# Patient Record
Sex: Female | Born: 1965 | Race: White | Hispanic: No | Marital: Married | Smoking: Never smoker
Health system: Southern US, Community
[De-identification: ages and names within clinical notes are randomized; demographics above are authoritative.]

## PROBLEM LIST (undated history)

## (undated) DIAGNOSIS — E039 Hypothyroidism, unspecified: Secondary | ICD-10-CM

## (undated) HISTORY — PX: FRACTURE SURGERY: SHX138

## (undated) HISTORY — PX: APPENDECTOMY: SHX54

---

## 2004-08-16 ENCOUNTER — Ambulatory Visit: Payer: Self-pay | Admitting: Specialist

## 2005-04-10 ENCOUNTER — Emergency Department: Payer: Self-pay | Admitting: Emergency Medicine

## 2005-04-10 ENCOUNTER — Ambulatory Visit: Payer: Self-pay | Admitting: Podiatry

## 2006-06-02 ENCOUNTER — Ambulatory Visit: Payer: Self-pay | Admitting: Internal Medicine

## 2008-03-11 ENCOUNTER — Emergency Department: Payer: Self-pay | Admitting: Emergency Medicine

## 2008-03-24 ENCOUNTER — Ambulatory Visit: Payer: Self-pay | Admitting: Podiatry

## 2009-02-20 IMAGING — US US EXTREM LOW VENOUS BILAT
1 series · 17 of 24 positions shown · non-contrast
Comparison: none

REASON FOR EXAM: bilateral leg edema
COMMENTS:

[Series 1: us extrem low venous bilat · 17 of 36 slices shown]
[im 1/36]
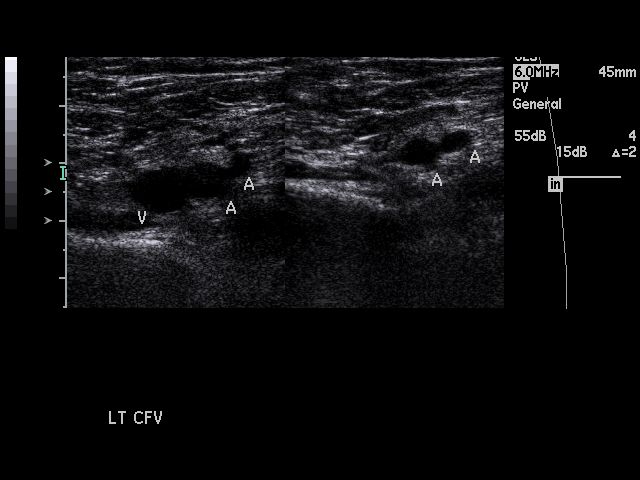
[im 4/36]
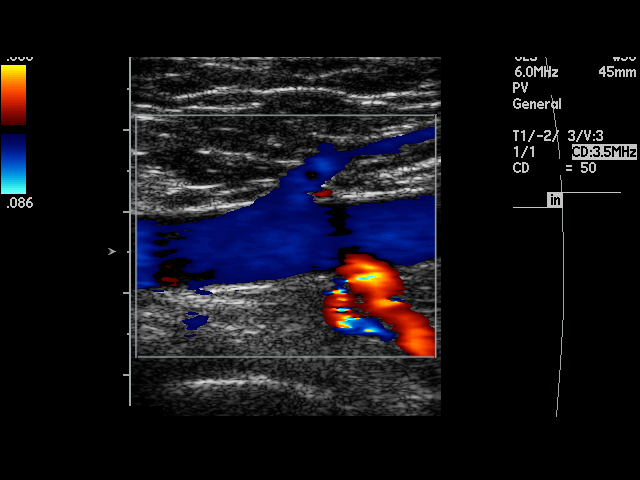
[im 5/36]
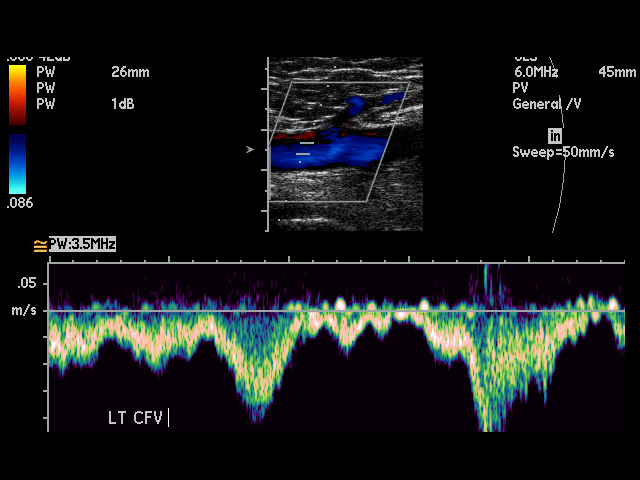
[im 7/36]
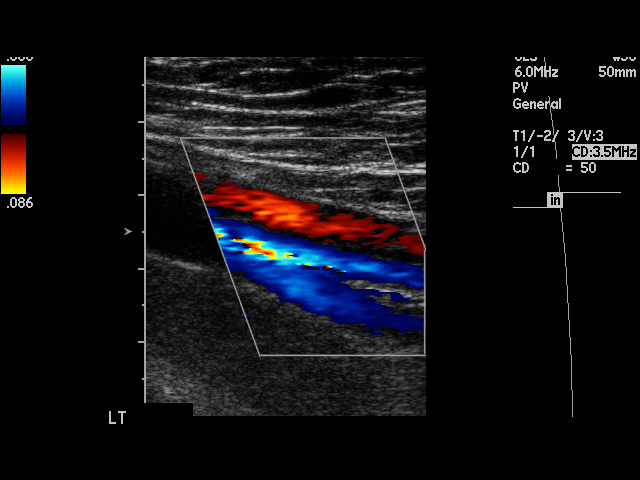
[im 10/36]
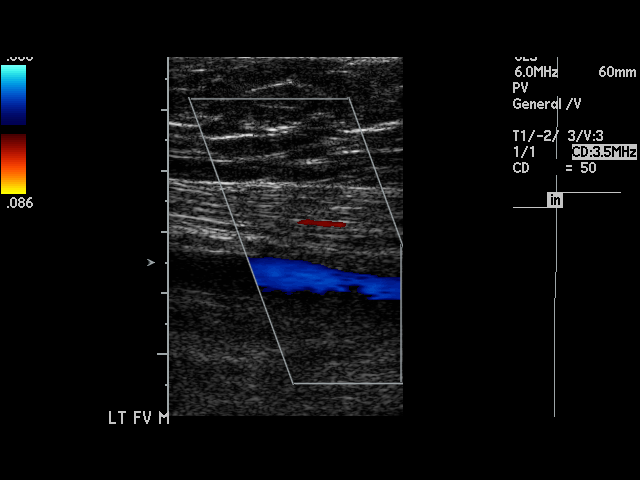
[im 11/36]
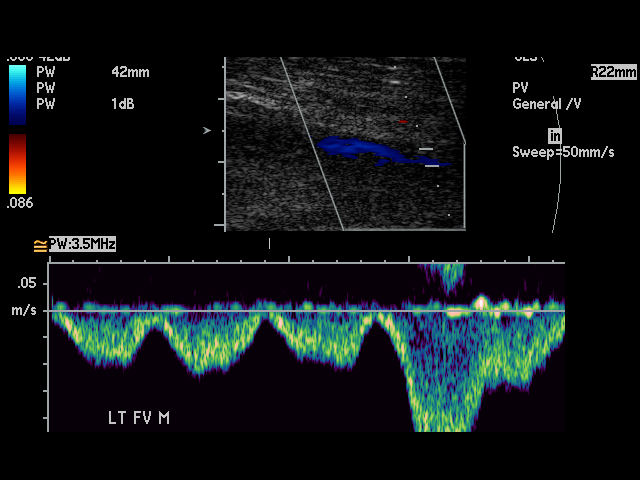
[im 14/36]
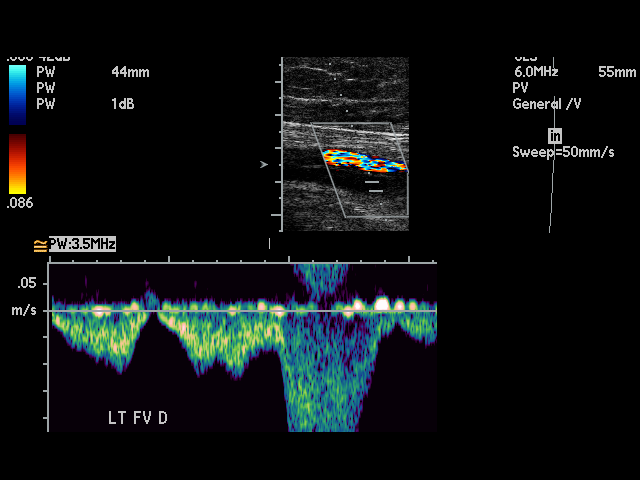
[im 16/36]
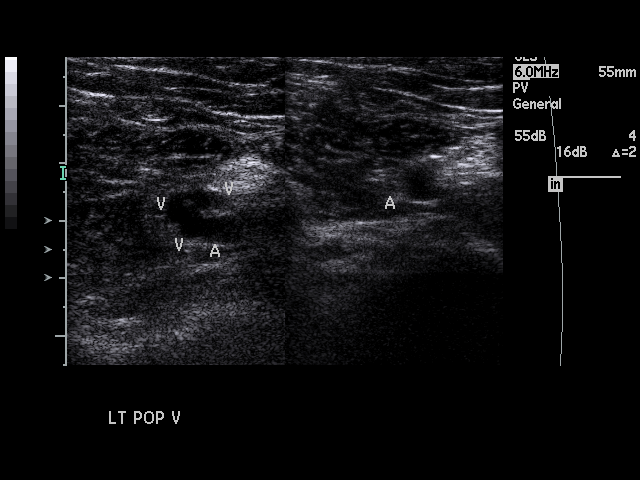
[im 19/36]
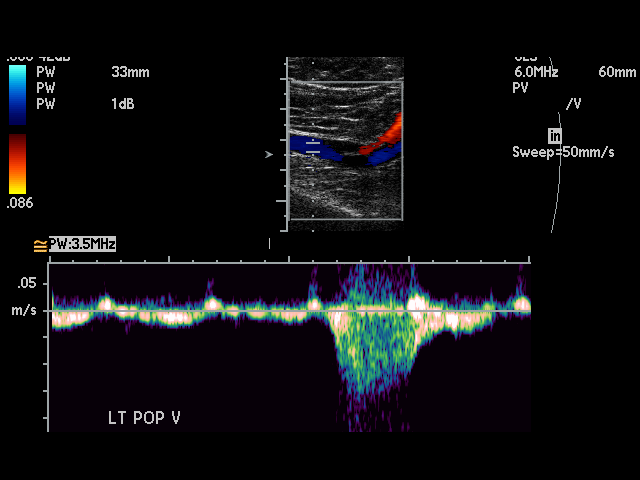
[im 20/36]
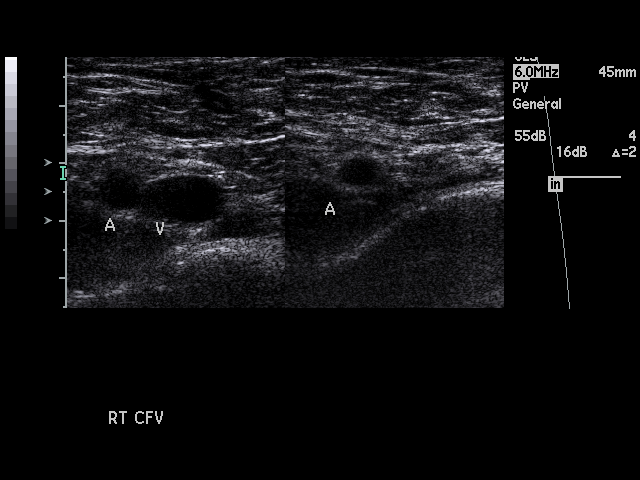
[im 22/36]
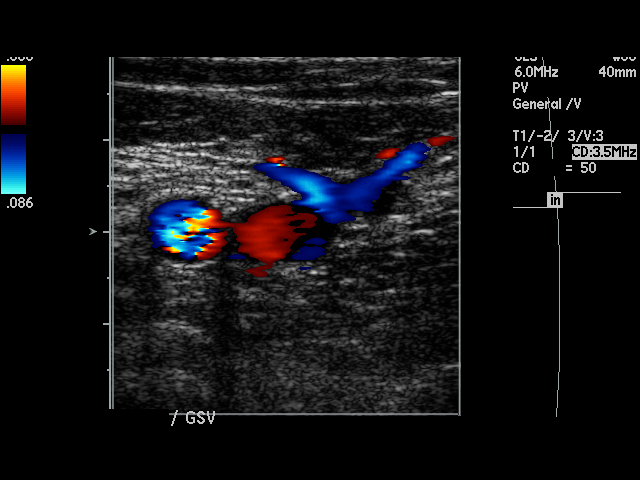
[im 25/36]
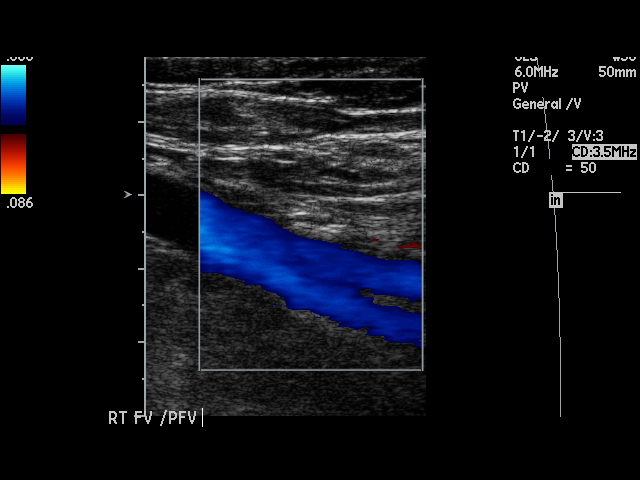
[im 26/36]
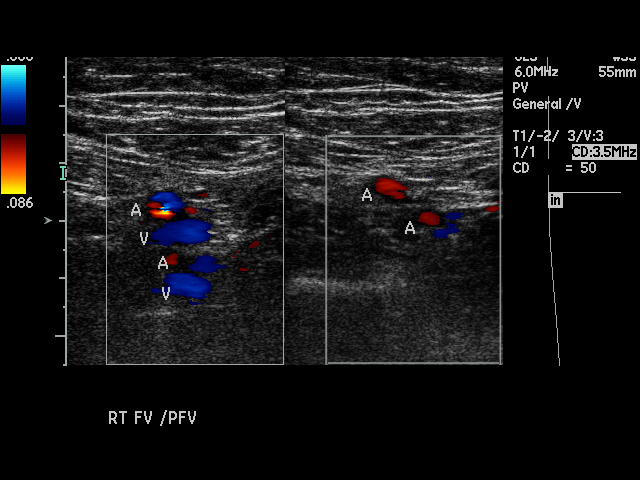
[im 29/36]
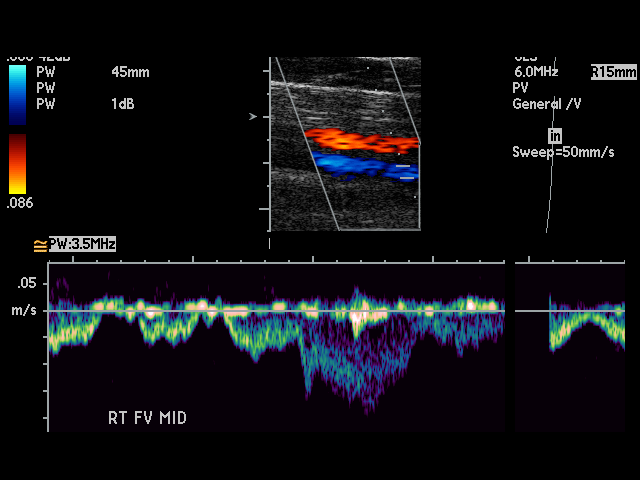
[im 31/36]
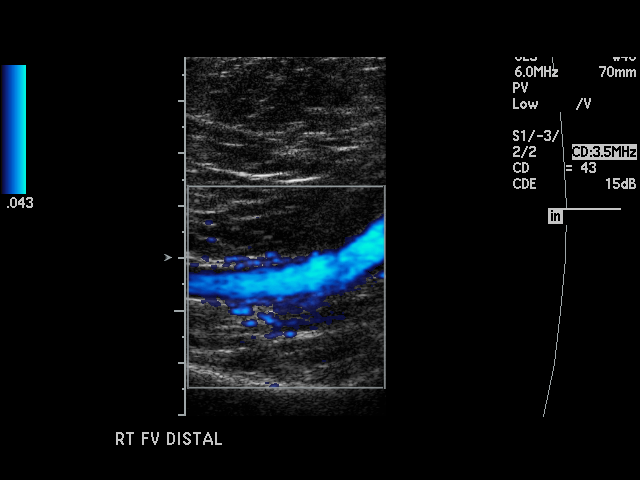
[im 32/36]
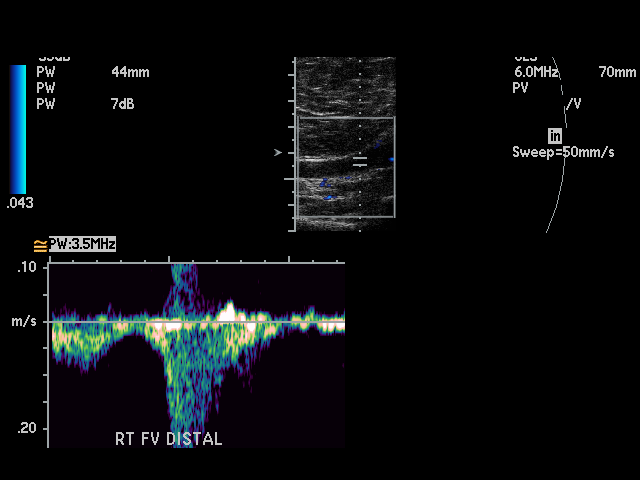
[im 36/36]
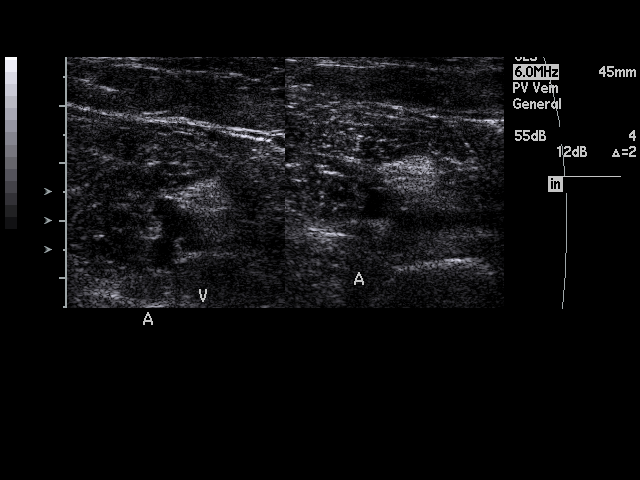

[17 of 24 positions shown; findings below may reference images not displayed]

PROCEDURE:     US  - US DOPPLER LOW EXTR BILATERAL  - June 02, 2006  [DATE]

RESULT:     The phasic and augmentation flow wave forms bilaterally are
normal in appearance. The femoral and popliteal veins bilaterally show
normal compressibility. Bilateral Doppler examination shows no deep venous
occlusion or thrombosis on either side.
IMPRESSION: 1.     Bilaterally normal study. No deep venous thrombosis is identified on
either side.

## 2009-08-15 ENCOUNTER — Emergency Department: Payer: Self-pay | Admitting: Emergency Medicine

## 2010-11-30 IMAGING — CR DG ANKLE COMPLETE 3+V*L*
1 series · 5 of 5 positions shown · non-contrast
Comparison: none

REASON FOR EXAM: pain/injury
COMMENTS:

[Series 1: view not recorded · 0.17mm/px · 5 of 5 slices shown]
[im 1/5]
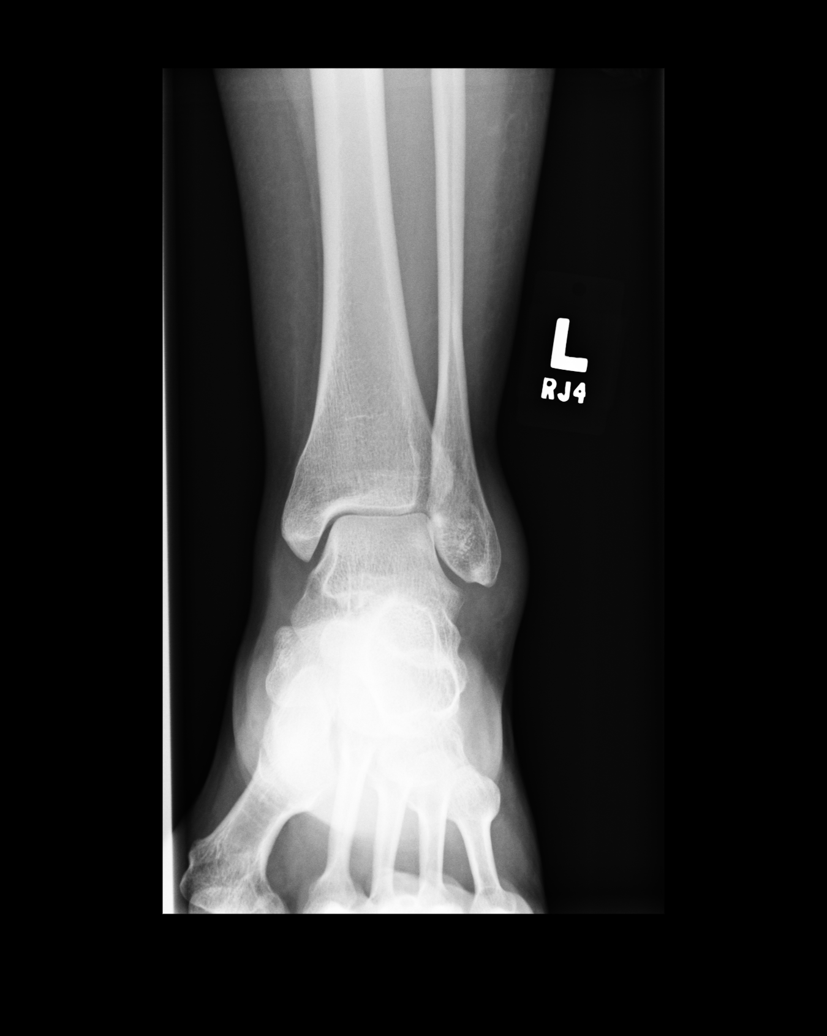
[im 2/5]
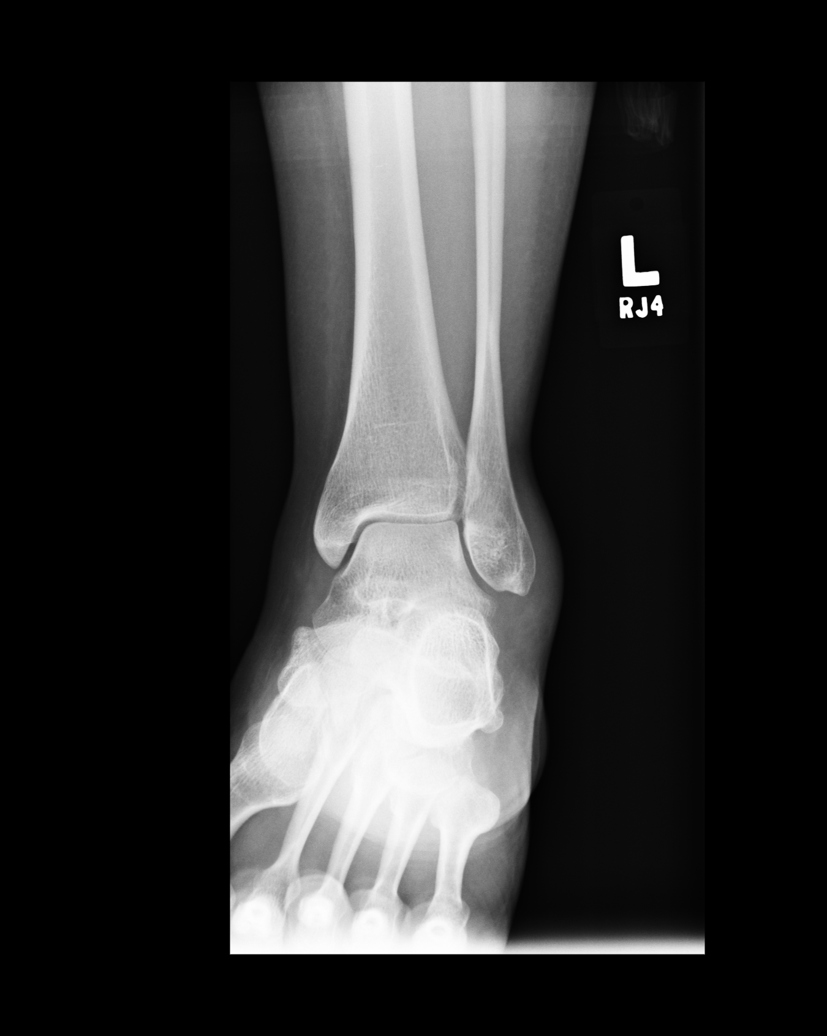
[im 3/5]
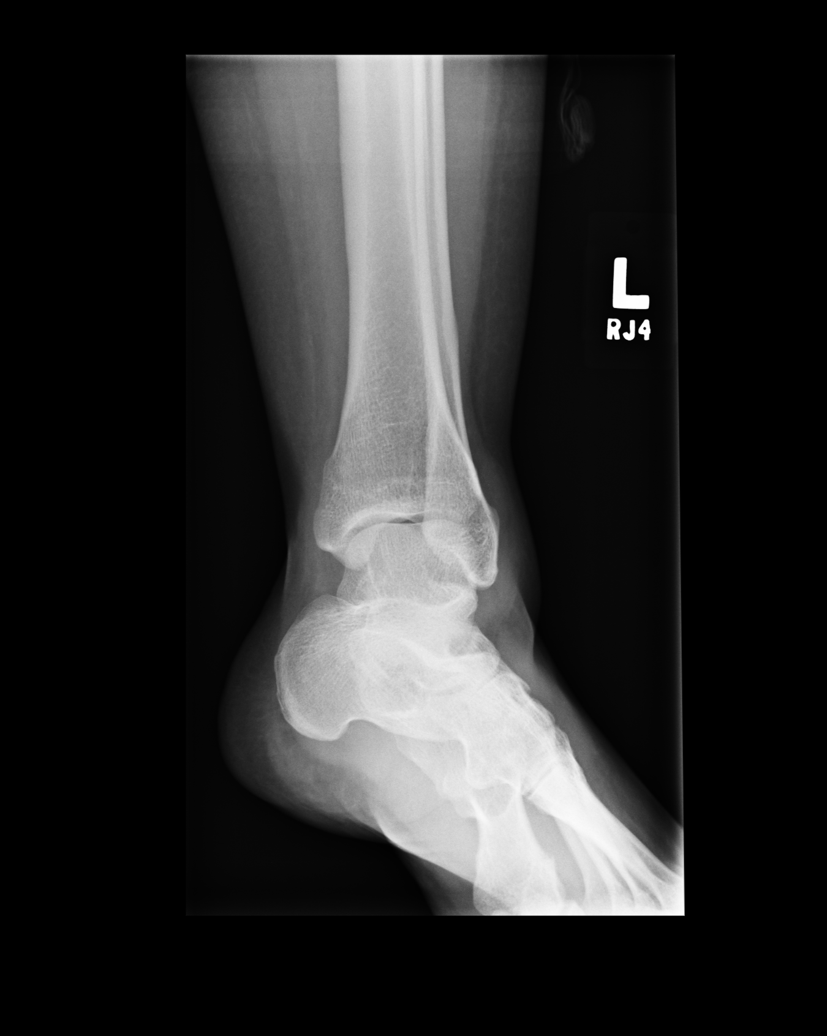
[im 4/5]
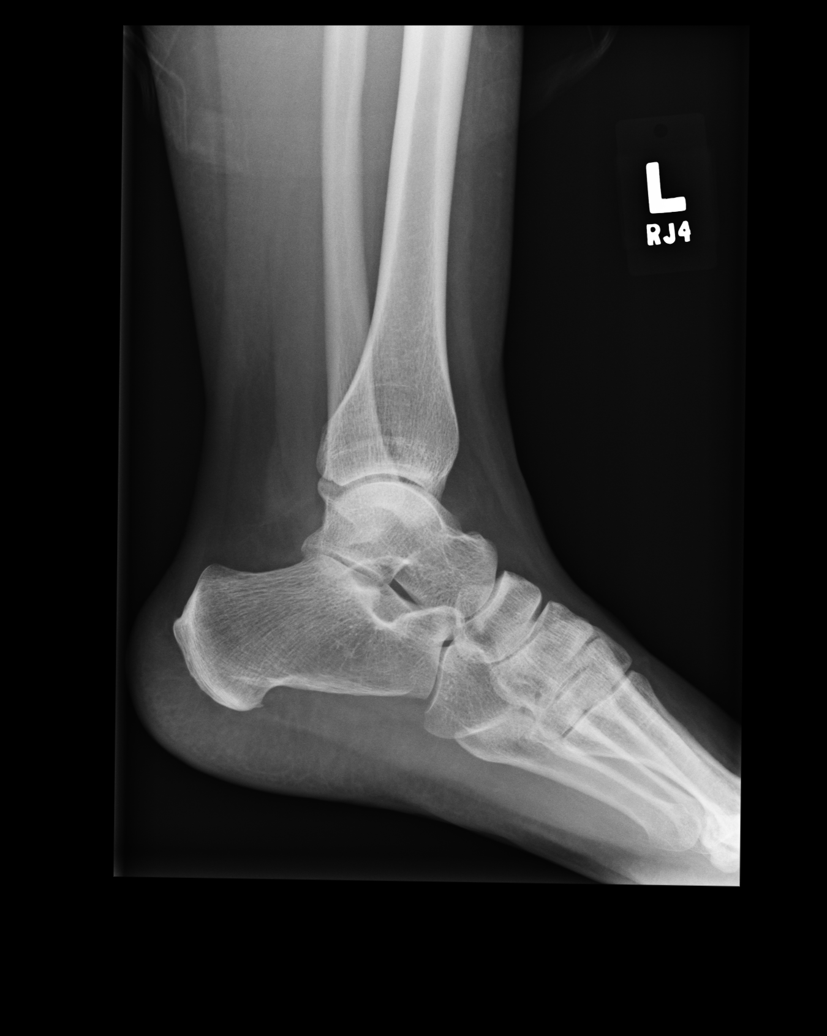
[im 5/5]
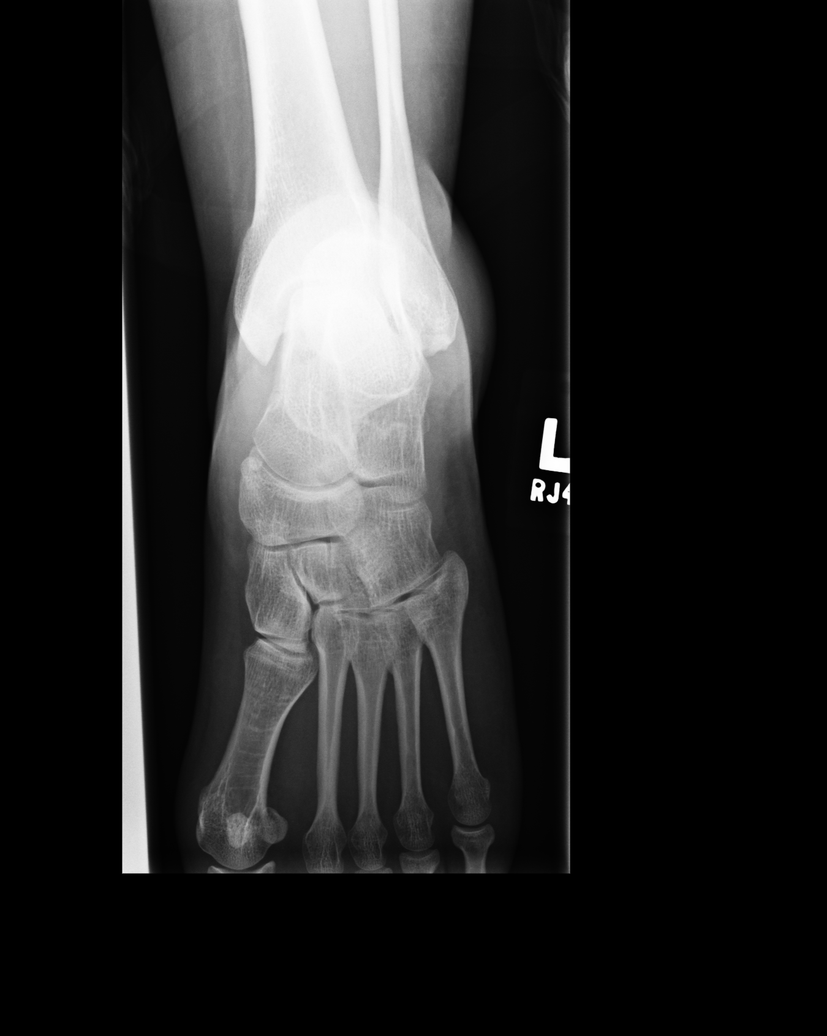

[5 of 5 positions shown; findings below may reference images not displayed]

PROCEDURE:     DXR - DXR ANKLE LEFT COMPLETE  - March 11, 2008  [DATE]

RESULT:     There is considerable soft tissue swelling over the lateral
malleolus. The distal fibula appears intact. The medial and posterior
malleoli appear intact as well. The tarsal bones and metatarsal bases are
grossly intact. The talar dome is intact.
IMPRESSION: There is soft tissue swelling over the lateral malleolus
consistent with underlying ligamentous injury. I do not see evidence of an
acute fracture.

## 2020-07-19 ENCOUNTER — Telehealth (INDEPENDENT_AMBULATORY_CARE_PROVIDER_SITE_OTHER): Payer: Managed Care, Other (non HMO) | Admitting: Gastroenterology

## 2020-07-19 DIAGNOSIS — Z1211 Encounter for screening for malignant neoplasm of colon: Secondary | ICD-10-CM

## 2020-07-19 MED ORDER — NA SULFATE-K SULFATE-MG SULF 17.5-3.13-1.6 GM/177ML PO SOLN: 1.0000 | Freq: Once | ORAL | 0 refills | Status: AC

## 2020-07-19 NOTE — Progress Notes (Signed)
Gastroenterology Pre-Procedure Review  Request Date: 08/24/20 Requesting Physician: Dr. Bonna Gains  PATIENT REVIEW QUESTIONS: The patient responded to the following health history questions as indicated:    1. Are you having any GI issues? no 2. Do you have a personal history of Polyps? no 3. Do you have a family history of Colon Cancer or Polyps? no 4. Diabetes Mellitus? no 5. Joint replacements in the past 12 months?no 6. Major health problems in the past 3 months?no 7. Any artificial heart valves, MVP, or defibrillator?no    MEDICATIONS & ALLERGIES:    Patient reports the following regarding taking any anticoagulation/antiplatelet therapy:   Plavix, Coumadin, Eliquis, Xarelto, Lovenox, Pradaxa, Brilinta, or Effient? no Aspirin? no  Patient confirms/reports the following medications:  No current outpatient medications on file.   No current facility-administered medications for this visit.    Patient confirms/reports the following allergies:  Not on File  No orders of the defined types were placed in this encounter.   AUTHORIZATION INFORMATION Primary Insurance: 1D#: Group #:  Secondary Insurance: 1D#: Group #:  SCHEDULE INFORMATION: Date: 08/24/20 Time: Location: [place]

## 2020-08-24 ENCOUNTER — Other Ambulatory Visit: Payer: Self-pay

## 2020-08-24 ENCOUNTER — Ambulatory Visit: Payer: Managed Care, Other (non HMO) | Admitting: Certified Registered"

## 2020-08-24 ENCOUNTER — Encounter
Admission: RE | Disposition: A | Discharge status: Home / Self Care | Payer: Self-pay | Source: Home / Self Care | Attending: Gastroenterology

## 2020-08-24 ENCOUNTER — Ambulatory Visit
Admission: RE | Admit: 2020-08-24 | Discharge: 2020-08-24 | Disposition: A | Discharge status: Home / Self Care | Payer: Managed Care, Other (non HMO) | Attending: Gastroenterology | Admitting: Gastroenterology

## 2020-08-24 ENCOUNTER — Encounter: Payer: Self-pay | Admitting: Gastroenterology

## 2020-08-24 DIAGNOSIS — E039 Hypothyroidism, unspecified: Secondary | ICD-10-CM | POA: Diagnosis not present

## 2020-08-24 DIAGNOSIS — K635 Polyp of colon: Secondary | ICD-10-CM | POA: Diagnosis not present

## 2020-08-24 DIAGNOSIS — Z1211 Encounter for screening for malignant neoplasm of colon: Secondary | ICD-10-CM | POA: Diagnosis not present

## 2020-08-24 DIAGNOSIS — Z9049 Acquired absence of other specified parts of digestive tract: Secondary | ICD-10-CM | POA: Diagnosis not present

## 2020-08-24 DIAGNOSIS — Z79890 Hormone replacement therapy (postmenopausal): Secondary | ICD-10-CM | POA: Diagnosis not present

## 2020-08-24 DIAGNOSIS — D120 Benign neoplasm of cecum: Secondary | ICD-10-CM | POA: Insufficient documentation

## 2020-08-24 DIAGNOSIS — K648 Other hemorrhoids: Secondary | ICD-10-CM | POA: Diagnosis not present

## 2020-08-24 DIAGNOSIS — Z881 Allergy status to other antibiotic agents status: Secondary | ICD-10-CM | POA: Diagnosis not present

## 2020-08-24 DIAGNOSIS — D125 Benign neoplasm of sigmoid colon: Secondary | ICD-10-CM | POA: Insufficient documentation

## 2020-08-24 DIAGNOSIS — D124 Benign neoplasm of descending colon: Secondary | ICD-10-CM | POA: Diagnosis not present

## 2020-08-24 HISTORY — DX: Hypothyroidism, unspecified: E03.9

## 2020-08-24 HISTORY — PX: COLONOSCOPY WITH PROPOFOL: SHX5780

## 2020-08-24 LAB — POCT PREGNANCY, URINE: Preg Test, Ur: NEGATIVE

## 2020-08-24 SURGERY — COLONOSCOPY WITH PROPOFOL
Anesthesia: General

## 2020-08-24 MED ORDER — PROPOFOL 500 MG/50ML IV EMUL
INTRAVENOUS | Status: DC | PRN
  Administered 2020-08-24: 125 ug/kg/min via INTRAVENOUS

## 2020-08-24 MED ORDER — PROPOFOL 10 MG/ML IV BOLUS
INTRAVENOUS | Status: DC | PRN
  Administered 2020-08-24: 60 mg via INTRAVENOUS
  Administered 2020-08-24: 20 mg via INTRAVENOUS

## 2020-08-24 MED ORDER — PROPOFOL 500 MG/50ML IV EMUL
INTRAVENOUS | Status: AC
  Filled 2020-08-24: qty 50

## 2020-08-24 MED ORDER — SODIUM CHLORIDE 0.9 % IV SOLN: INTRAVENOUS | Status: DC

## 2020-08-24 MED ORDER — LIDOCAINE HCL (CARDIAC) PF 100 MG/5ML IV SOSY
PREFILLED_SYRINGE | INTRAVENOUS | Status: DC | PRN
  Administered 2020-08-24: 50 mg via INTRAVENOUS

## 2020-08-24 NOTE — Anesthesia Preprocedure Evaluation (Signed)
Anesthesia Evaluation  Patient identified by MRN, date of birth, ID band Patient awake    Reviewed: Allergy & Precautions, NPO status , Patient's Chart, lab work & pertinent test results  Airway Mallampati: I  TM Distance: >3 FB Neck ROM: Full    Dental no notable dental hx. (+) Teeth Intact   Pulmonary    Pulmonary exam normal        Cardiovascular Exercise Tolerance: Good Normal cardiovascular examI     Neuro/Psych    GI/Hepatic   Endo/Other    Renal/GU      Musculoskeletal   Abdominal Normal abdominal exam  (+)   Peds  Hematology   Anesthesia Other Findings   Reproductive/Obstetrics                             Anesthesia Physical Anesthesia Plan  ASA: 1  Anesthesia Plan: General   Post-op Pain Management:    Induction: Intravenous  PONV Risk Score and Plan:   Airway Management Planned: Simple Face Mask  Additional Equipment: None  Intra-op Plan:   Post-operative Plan:   Informed Consent: I have reviewed the patients History and Physical, chart, labs and discussed the procedure including the risks, benefits and alternatives for the proposed anesthesia with the patient or authorized representative who has indicated his/her understanding and acceptance.       Plan Discussed with: CRNA  Anesthesia Plan Comments:         Anesthesia Quick Evaluation

## 2020-08-24 NOTE — Anesthesia Postprocedure Evaluation (Signed)
Anesthesia Post Note  Patient: Kara Roy  Procedure(s) Performed: COLONOSCOPY WITH PROPOFOL  Patient location during evaluation: PACU Anesthesia Type: General Level of consciousness: awake and alert Pain management: pain level controlled Vital Signs Assessment: post-procedure vital signs reviewed and stable Respiratory status: spontaneous breathing, nonlabored ventilation, respiratory function stable and patient connected to nasal cannula oxygen Cardiovascular status: blood pressure returned to baseline and stable Postop Assessment: no apparent nausea or vomiting Anesthetic complications: no   No notable events documented.   Last Vitals:  Vitals:   08/24/20 0909 08/24/20 0919  BP: (!) 113/55 120/77  Pulse: 79 81  Resp: 18 14  Temp:    SpO2: 100% 100%    Last Pain:  Vitals:   08/24/20 0919  TempSrc:   PainSc: 0-No pain                 Marte Celani Doyne Keel

## 2020-08-24 NOTE — Op Note (Signed)
[facility] Gastroenterology Patient Name: Kara Roy Procedure Date: 08/24/2020 8:11 AM MRN: [id number] Account #: [id number] Date of Birth: [birth year] Admit Type: Outpatient Age: 55 Room: [facility] ENDO ROOM 2 Gender: Female Note Status: Finalized Procedure:             Colonoscopy Indications:           Screening for colorectal malignant neoplasm Providers:             Thane Age B. Bonna Gains MD, MD Referring MD:          Leona Carry. Hall Busing, MD (Referring MD) Medicines:             Monitored Anesthesia Care Complications:         No immediate complications. Procedure:             Pre-Anesthesia Assessment:                        - ASA Grade Assessment: II - A patient with mild                         systemic disease.                        - Prior to the procedure, a History and Physical was                         performed, and patient medications, allergies and                         sensitivities were reviewed. The patient's tolerance                         of previous anesthesia was reviewed.                        - The risks and benefits of the procedure and the                         sedation options and risks were discussed with the                         patient. All questions were answered and informed                         consent was obtained.                        - Patient identification and proposed procedure were                         verified prior to the procedure by the physician, the                         nurse, the anesthesiologist, the anesthetist and the                         technician. The procedure was verified in the                         procedure  room.                        After obtaining informed consent, the colonoscope was                         passed under direct vision. Throughout the procedure,                         the patient's blood pressure, pulse, and oxygen                         saturations were  monitored continuously. The                         Colonoscope was introduced through the anus and                         advanced to the the cecum, identified by appendiceal                         orifice and ileocecal valve. The colonoscopy was                         performed with ease. The patient tolerated the                         procedure well. The quality of the bowel preparation                         was fair. Findings:      The perianal and digital rectal examinations were normal.      Two sessile polyps were found in the descending colon and cecum. The       polyps were 4 to 6 mm in size. These polyps were removed with a cold       snare. Resection and retrieval were complete.      A 10 mm polyp was found in the sigmoid colon. The polyp was       semi-pedunculated. The polyp was removed with a hot snare. Resection and       retrieval were complete.      The exam was otherwise without abnormality.      The rectum, sigmoid colon, descending colon, transverse colon, ascending       colon and cecum appeared normal.      Non-bleeding internal hemorrhoids were found during retroflexion. Impression:            - Preparation of the colon was fair.                        - Two 4 to 6 mm polyps in the descending colon and in                         the cecum, removed with a cold snare. Resected and                         retrieved.                        - One  10 mm polyp in the sigmoid colon, removed with a                         hot snare. Resected and retrieved.                        - The examination was otherwise normal.                        - The rectum, sigmoid colon, descending colon,                         transverse colon, ascending colon and cecum are normal.                        - Non-bleeding internal hemorrhoids. Recommendation:        - Discharge patient to home (with escort).                        - Advance diet as tolerated.                        -  Continue present medications.                        - Await pathology results.                        - Repeat colonoscopy date to be determined after                         pending pathology results are reviewed.                        - The findings and recommendations were discussed with                         the patient.                        - The findings and recommendations were discussed with                         the patient's family.                        - Return to primary care physician as previously                         scheduled.                        - High fiber diet. Procedure Code(s):     --- Professional ---                        [phone], Colonoscopy, flexible; with removal of                         tumor(s), polyp(s), or other lesion(s) by snare  technique Diagnosis Code(s):     --- Professional ---                        K63.5, Polyp of colon                        Z12.11, Encounter for screening for malignant neoplasm                         of colon CPT copyright 2019 American Medical Association. All rights reserved. The codes documented in this report are preliminary and upon coder review may  be revised to meet current compliance requirements.  Vonda Antigua, MD Margretta Sidle B. Bonna Gains MD, MD 08/24/2020 9:12:12 AM This report has been signed electronically. Number of Addenda: 0 Note Initiated On: 08/24/2020 8:11 AM Scope Withdrawal Time: 0 hours 30 minutes 33 seconds  Total Procedure Duration: 0 hours 37 minutes 41 seconds  Estimated Blood Loss:  Estimated blood loss: none.      [facility]

## 2020-08-24 NOTE — Anesthesia Procedure Notes (Signed)
Procedure Name: MAC Date/Time: 08/24/2020 8:10 AM Performed by: Jerrye Noble, CRNA Pre-anesthesia Checklist: Patient identified, Emergency Drugs available, Suction available and Patient being monitored Patient Re-evaluated:Patient Re-evaluated prior to induction Oxygen Delivery Method: Nasal cannula

## 2020-08-24 NOTE — H&P (Signed)
  Vonda Antigua, MD [address] [place], [place], [place], [address] [id number], [place], [place], [place], [address] Phone: [phone]  Fax: [phone]  Primary Care Physician:  Albina Billet, MD   Pre-Procedure History & Physical: HPI:  Kara Roy is a 55 y.o. female is here for a colonoscopy.   Past Medical History:  Diagnosis Date   Hypothyroidism     Past Surgical History:  Procedure Laterality Date   APPENDECTOMY     FRACTURE SURGERY      Prior to Admission medications   Medication Sig Start Date End Date Taking? Authorizing Provider  levothyroxine (SYNTHROID) 88 MCG tablet Take 88 mcg by mouth daily. 06/15/20  Yes [provider]    Allergies as of 07/19/2020 - Review Complete 07/19/2020  Allergen Reaction Noted   Ciprofloxacin Rash 07/19/2020    History reviewed. No pertinent family history.  Social History   Socioeconomic History   Marital status: Single    Spouse name: Not on file   Number of children: Not on file   Years of education: Not on file   Highest education level: Not on file  Occupational History   Not on file  Tobacco Use   Smoking status: Never   Smokeless tobacco: Never  Vaping Use   Vaping Use: Never used  Substance and Sexual Activity   Alcohol use: Never   Drug use: Never   Sexual activity: Not on file  Other Topics Concern   Not on file  Social History Narrative   Not on file   Social Determinants of Health   Financial Resource Strain: Not on file  Food Insecurity: Not on file  Transportation Needs: Not on file  Physical Activity: Not on file  Stress: Not on file  Social Connections: Not on file  Intimate Partner Violence: Not on file    Review of Systems: See HPI, otherwise negative ROS  Physical Exam: Constitutional: General:   Alert,  Well-developed, well-nourished, pleasant and cooperative in NAD BP 120/87   Pulse 99   Temp 97.7 F (36.5 C) (Temporal)   Resp 18   Ht '5\' 3"'$  (1.6  m)   Wt 77.1 kg   SpO2 98%   BMI 30.11 kg/m   Head: Normocephalic, atraumatic.   Eyes:  Sclera clear, no icterus.   Conjunctiva pink.   Mouth:  No deformity or lesions, oropharynx pink & moist.  Neck:  Supple, trachea midline  Respiratory: Normal respiratory effort  Gastrointestinal:  Soft, non-tender and non-distended without masses, hepatosplenomegaly or hernias noted.  No guarding or rebound tenderness.     Cardiac: No clubbing or edema.  No cyanosis. Normal posterior tibial pedal pulses noted.  Lymphatic:  No significant cervical adenopathy.  Psych:  Alert and cooperative. Normal mood and affect.  Musculoskeletal:   Symmetrical without gross deformities. 5/5 Lower extremity strength bilaterally.  Skin: Warm. Intact without significant lesions or rashes. No jaundice.  Neurologic:  Face symmetrical, tongue midline, Normal sensation to touch;  grossly normal neurologically.  Psych:  Alert and oriented x3, Alert and cooperative. Normal mood and affect.  Impression/Plan: Kara Roy is here for a colonoscopy to be performed for average risk screening.  Risks, benefits, limitations, and alternatives regarding  colonoscopy have been reviewed with the patient.  Questions have been answered.  All parties agreeable.   Virgel Manifold, MD  08/24/2020, 8:10 AM

## 2020-08-24 NOTE — Transfer of Care (Signed)
Immediate Anesthesia Transfer of Care Note  Patient: Kara Roy  Procedure(s) Performed: COLONOSCOPY WITH PROPOFOL  Patient Location: PACU and Endoscopy Unit  Anesthesia Type:General  Level of Consciousness: drowsy and patient cooperative  Airway & Oxygen Therapy: Patient Spontanous Breathing  Post-op Assessment: Report given to RN and Post -op Vital signs reviewed and stable  Post vital signs: Reviewed and stable  Last Vitals:  Vitals Value Taken Time  BP 115/61 08/24/20 0900  Temp 36.2 C 08/24/20 0859  Pulse 84 08/24/20 0902  Resp 15 08/24/20 0902  SpO2 100 % 08/24/20 0902  Vitals shown include unvalidated device data.  Last Pain:  Vitals:   08/24/20 0859  TempSrc: Temporal  PainSc: 0-No pain         Complications: No notable events documented.

## 2020-08-27 ENCOUNTER — Encounter: Payer: Self-pay | Admitting: Gastroenterology

## 2020-08-27 LAB — SURGICAL PATHOLOGY

## 2020-08-29 ENCOUNTER — Telehealth: Payer: Self-pay | Admitting: Gastroenterology

## 2020-08-29 ENCOUNTER — Encounter: Payer: Self-pay | Admitting: Gastroenterology

## 2020-08-29 NOTE — Telephone Encounter (Signed)
Patient lvm for pathology results.

## 2020-12-02 LAB — LAB REPORT - SCANNED: EGFR: 105

## 2021-06-08 LAB — LAB REPORT - SCANNED
Free T4: 1.71 ng/dL
TSH: 0.247

## 2021-09-28 LAB — TSH+FREE T4
Free T4: 1.7 ng/dL
TSH: 0.256

## 2021-12-14 LAB — LAB REPORT - SCANNED
A1c: 5.5
EGFR: 97

## 2021-12-20 LAB — HM PAP SMEAR

## 2022-03-07 ENCOUNTER — Emergency Department
Admission: EM | Admit: 2022-03-07 | Discharge: 2022-03-07 | Disposition: A | Discharge status: Home / Self Care | Payer: Managed Care, Other (non HMO) | Attending: Emergency Medicine | Admitting: Emergency Medicine

## 2022-03-07 ENCOUNTER — Other Ambulatory Visit: Payer: Self-pay

## 2022-03-07 ENCOUNTER — Emergency Department: Payer: Managed Care, Other (non HMO)

## 2022-03-07 DIAGNOSIS — S0990XA Unspecified injury of head, initial encounter: Secondary | ICD-10-CM

## 2022-03-07 DIAGNOSIS — W01198A Fall on same level from slipping, tripping and stumbling with subsequent striking against other object, initial encounter: Secondary | ICD-10-CM | POA: Diagnosis not present

## 2022-03-07 DIAGNOSIS — Z20822 Contact with and (suspected) exposure to covid-19: Secondary | ICD-10-CM | POA: Diagnosis not present

## 2022-03-07 LAB — CBC
HCT: 38.3 % (ref 36.0–46.0)
Hemoglobin: 12.6 g/dL (ref 12.0–15.0)
MCH: 28.2 pg (ref 26.0–34.0)
MCHC: 32.9 g/dL (ref 30.0–36.0)
MCV: 85.7 fL (ref 80.0–100.0)
Platelets: 218 10*3/uL (ref 150–400)
RBC: 4.47 MIL/uL (ref 3.87–5.11)
RDW: 12.9 % (ref 11.5–15.5)
WBC: 8.5 10*3/uL (ref 4.0–10.5)
nRBC: 0 % (ref 0.0–0.2)

## 2022-03-07 LAB — COMPREHENSIVE METABOLIC PANEL
ALT: 28 U/L (ref 0–44)
AST: 28 U/L (ref 15–41)
Albumin: 3.8 g/dL (ref 3.5–5.0)
Alkaline Phosphatase: 65 U/L (ref 38–126)
Anion gap: 8 (ref 5–15)
BUN: 12 mg/dL (ref 6–20)
CO2: 29 mmol/L (ref 22–32)
Calcium: 8.9 mg/dL (ref 8.9–10.3)
Chloride: 103 mmol/L (ref 98–111)
Creatinine, Ser: 0.73 mg/dL (ref 0.44–1.00)
GFR, Estimated: >60 mL/min (ref >60)
Glucose, Bld: 78 mg/dL (ref 70–99)
Potassium: 3.6 mmol/L (ref 3.5–5.1)
Sodium: 140 mmol/L (ref 135–145)
Total Bilirubin: 1 mg/dL (ref 0.3–1.2)
Total Protein: 7.1 g/dL (ref 6.5–8.1)

## 2022-03-07 LAB — RESP PANEL BY RT-PCR (RSV, FLU A&B, COVID)  RVPGX2
Influenza A by PCR: NEGATIVE
Influenza B by PCR: NEGATIVE
Resp Syncytial Virus by PCR: NEGATIVE
SARS Coronavirus 2 by RT PCR: NEGATIVE

## 2022-03-07 LAB — TROPONIN I (HIGH SENSITIVITY): Troponin I (High Sensitivity): <2 ng/L (ref <18)

## 2022-03-07 MED ORDER — SODIUM CHLORIDE 0.9 % IV BOLUS
1000.0000 mL | Freq: Once | INTRAVENOUS | Status: AC
  Administered 2022-03-07: 1000 mL via INTRAVENOUS

## 2022-03-07 MED ORDER — BUTALBITAL-APAP-CAFFEINE 50-325-40 MG PO TABS: 1.0000 | ORAL_TABLET | Freq: Four times a day (QID) | ORAL | 0 refills | Status: AC | PRN

## 2022-03-07 NOTE — ED Triage Notes (Signed)
Patient states was having bowel movement two days ago when became cold and clammy and had syncopal episode, woke up on the floor with bump on her left forehead and pain to face. Patient states today she is feeling very fatigued and weak and has continued headache and face pain, denies blurred vision and dizziness at this time.

## 2022-03-07 NOTE — ED Provider Notes (Signed)
[facility] Provider Note    Event Date/Time   First MD Initiated Contact with Patient 03/07/22 1254     (approximate)  History   Chief Complaint: Head Injury  HPI  Kara Roy is a 57 y.o. female with no significant past medical history presents to the emergency department for continued headache blurred vision and fatigue.  According to the patient on Wednesday she was having a bowel movement when she became very lightheaded and dizzy.  Patient states he ultimately passed out falling forward hitting her head on the ground.  Patient states forehead hematoma and tenderness.  He states since that time she has felt generalized fatigue and weak at times although no focal weakness.  Denies any fever cough or congestion.  Denies any abdominal pain or chest pain.  Denies any black or bloody stool or urinary symptoms.  Patient has been using Tylenol at home but continues to state headache.  Physical Exam   Triage Vital Signs: ED Triage Vitals  Enc Vitals Group     BP 03/07/22 1222 (!) 174/95     Pulse Rate 03/07/22 1222 (!) 105     Resp 03/07/22 1222 16     Temp 03/07/22 1222 98.1 F (36.7 C)     Temp Source 03/07/22 1222 Oral     SpO2 03/07/22 1222 99 %     Weight 03/07/22 1217 173 lb 3.2 oz (78.6 kg)     Height 03/07/22 1217 '5\' 3"'$  (1.6 m)     Head Circumference --      Peak Flow --      Pain Score 03/07/22 1222 5     Pain Loc --      Pain Edu? --      Excl. in [place]? --     Most recent vital signs: Vitals:   03/07/22 1222  BP: (!) 174/95  Pulse: (!) 105  Resp: 16  Temp: 98.1 F (36.7 C)  SpO2: 99%    General: Awake, no distress.  Tenderness along the forehead. CV:  Good peripheral perfusion.  Regular rate and rhythm  Resp:  Normal effort.  Equal breath sounds bilaterally.  Abd:  No distention.  Soft, nontender.  No rebound or guarding.  ED Results / Procedures / Treatments   EKG  EKG viewed and interpreted by myself shows sinus rhythm 83  bpm with a normal axis, normal intervals, nonspecific but no concerning ST changes.  RADIOLOGY  CT pending   MEDICATIONS ORDERED IN ED: Medications  sodium chloride 0.9 % bolus 1,000 mL (has no administration in time range)     IMPRESSION / MDM / ASSESSMENT AND PLAN / ED COURSE  I reviewed the triage vital signs and the nursing notes.  Patient's presentation is most consistent with acute presentation with potential threat to life or bodily function.  Patient presents emergency department after a fall and head injury.  Overall patient appears well, no distress.  Mild hypertension otherwise reassuring vitals.  Patient continues to have headache some blurred vision and foggy feeling.  Patient states a sensation of generalized fatigue and weakness as well.  We will check labs including 1 set of cardiac enzymes.  We will check a COVID/flu as a precaution as well.  We will IV hydrate and obtain a CT scan of the head.  Patient does have tenderness along the forehead.  Patient agreeable to plan of care.  Differential would include concussion, postconcussive syndrome, intracranial hemorrhage, infectious etiology like COVID or  influenza, metabolic or electrolyte abnormality or dehydration.  Patient's COVID/flu/RSV is negative, CBC is normal, chemistry is normal, troponin negative.  EKG is reassuring.  Patient CT scan is pending.  Patient care signed out to oncoming provider.  If CT negative anticipate discharge home.  Will prescribe Fioricet for the patient for possible concussion.  FINAL CLINICAL IMPRESSION(S) / ED DIAGNOSES   Head injury  Rx / DC Orders   Fioricet  Note:  This document was prepared using Dragon voice recognition software and may include unintentional dictation errors.   Harvest Dark, MD 03/07/22 1447

## 2022-06-28 LAB — LAB REPORT - SCANNED
Free T4: 1.56 ng/dL
TSH: 0.443

## 2022-11-15 ENCOUNTER — Emergency Department
Admission: EM | Admit: 2022-11-15 | Discharge: 2022-11-15 | Disposition: A | Discharge status: Home / Self Care | Payer: Managed Care, Other (non HMO) | Attending: Emergency Medicine | Admitting: Emergency Medicine

## 2022-11-15 ENCOUNTER — Ambulatory Visit
Admission: EM | Admit: 2022-11-15 | Discharge: 2022-11-15 | Disposition: A | Discharge status: Home / Self Care | Payer: Managed Care, Other (non HMO)

## 2022-11-15 ENCOUNTER — Emergency Department: Payer: Managed Care, Other (non HMO)

## 2022-11-15 ENCOUNTER — Other Ambulatory Visit: Payer: Self-pay

## 2022-11-15 DIAGNOSIS — S0031XA Abrasion of nose, initial encounter: Secondary | ICD-10-CM | POA: Diagnosis not present

## 2022-11-15 DIAGNOSIS — S0992XA Unspecified injury of nose, initial encounter: Secondary | ICD-10-CM | POA: Diagnosis present

## 2022-11-15 DIAGNOSIS — W228XXA Striking against or struck by other objects, initial encounter: Secondary | ICD-10-CM | POA: Insufficient documentation

## 2022-11-15 MED ORDER — KETOROLAC TROMETHAMINE 15 MG/ML IJ SOLN: 15.0000 mg | Freq: Once | INTRAMUSCULAR | Status: DC

## 2022-11-15 NOTE — ED Provider Notes (Signed)
[facility] Provider Note    Event Date/Time   First MD Initiated Contact with Patient 11/15/22 1245     (approximate)   History   Facial Injury   HPI  Kara Roy is a 57 y.o. female who presents today for evaluation of nose injury.  Patient reports that she was lifting a mirror off a shelf and the shelf slid and hit her in the nose.  Patient is concerned because she had nasal bone surgery for a broken nose many years ago.  She has not had any epistaxis.  She reports that she sustained an abrasion to her nasal bridge only.  There was no LOC.  She does not have a headache now.  Patient Active Problem List   Diagnosis Date Noted   Colon cancer screening    Polyp of colon           Physical Exam   Triage Vital Signs: ED Triage Vitals  Encounter Vitals Group     BP 11/15/22 1229 138/76     Systolic BP Percentile --      Diastolic BP Percentile --      Pulse Rate 11/15/22 1229 85     Resp 11/15/22 1229 18     Temp 11/15/22 1229 98 F (36.7 C)     Temp src --      SpO2 11/15/22 1229 98 %     Weight 11/15/22 1225 175 lb (79.4 kg)     Height 11/15/22 1225 5\' 3"  (1.6 m)     Head Circumference --      Peak Flow --      Pain Score 11/15/22 1225 8     Pain Loc --      Pain Education --      Exclude from Growth Chart --     Most recent vital signs: Vitals:   11/15/22 1229  BP: 138/76  Pulse: 85  Resp: 18  Temp: 98 F (36.7 C)  SpO2: 98%    Physical Exam Vitals and nursing note reviewed.  Constitutional:      General: Awake and alert. No acute distress.    Appearance: Normal appearance. The patient is normal weight.  HENT:     Head: Normocephalic.  Superficial abrasion to nasal bridge.  No septal hematoma.    Mouth: Mucous membranes are moist.  Eyes:     General: PERRL. Normal EOMs        Right eye: No discharge.        Left eye: No discharge.     Conjunctiva/sclera: Conjunctivae normal.  Cardiovascular:     Rate and  Rhythm: Normal rate and regular rhythm.     Pulses: Normal pulses.  Pulmonary:     Effort: Pulmonary effort is normal. No respiratory distress.     Breath sounds: Normal breath sounds.  Abdominal:     Abdomen is soft. There is no abdominal tenderness. No rebound or guarding. No distention. Musculoskeletal:        General: No swelling. Normal range of motion.     Cervical back: Normal range of motion and neck supple.  Skin:    General: Skin is warm and dry.     Capillary Refill: Capillary refill takes less than 2 seconds.     Findings: No rash.  Neurological:     Mental Status: The patient is awake and alert.      ED Results / Procedures / Treatments   Labs (all labs ordered  are listed, but only abnormal results are displayed) Labs Reviewed - No data to display   EKG     RADIOLOGY I independently reviewed and interpreted imaging and agree with radiologists findings.     PROCEDURES:  Critical Care performed:   Procedures   MEDICATIONS ORDERED IN ED: Medications  ketorolac (TORADOL) 15 MG/ML injection 15 mg (15 mg Intramuscular Not Given 11/15/22 1450)     IMPRESSION / MDM / ASSESSMENT AND PLAN / ED COURSE  I reviewed the triage vital signs and the nursing notes.   Differential diagnosis includes, but is not limited to, fracture, contusion, abrasion.  Patient is awake and alert, hemodynamically stable and afebrile.  She has no focal neurological deficits.  There is no periorbital, midface, or mandibular tenderness.  She has full and normal extraocular movements.  She has a very small superficial abrasion to her nasal bridge.  There is no septal hematoma.  CT face obtained in triage is negative for any acute findings.  We discussed symptomatic management and return precautions.  Patient or stands and agrees with plan.  She was discharged in stable condition.   Patient's presentation is most consistent with acute complicated illness / injury requiring diagnostic  workup.     FINAL CLINICAL IMPRESSION(S) / ED DIAGNOSES   Final diagnoses:  Nasal abrasion, initial encounter     Rx / DC Orders   ED Discharge Orders     None        Note:  This document was prepared using Dragon voice recognition software and may include unintentional dictation errors.   Keturah Shavers 11/15/22 1453    Jene Every, MD 11/15/22 [phone]

## 2022-11-15 NOTE — ED Triage Notes (Signed)
Pt comes with c/o possible nose injury. Pt states she was moving a mirror and the shelf fell hitting her in the face. Pt does have some bleeding and abrasion to top of nose.

## 2022-11-15 NOTE — Discharge Instructions (Signed)
Your CT scan was negative for broken bones or nasal bone fracture.  Please follow-up with your outpatient provider.  Please return for any new, worsening, or change in symptoms or other concerns.  It was a pleasure caring for you today.

## 2022-12-27 LAB — LAB REPORT - SCANNED
A1c: 5.6
EGFR: 103
Free T4: 1.54 ng/dL
TSH: 0.892

## 2023-06-20 LAB — LAB REPORT - SCANNED
Free T4: 1.31 ng/dL
TSH: 0.72

## 2023-09-30 ENCOUNTER — Ambulatory Visit: Admitting: Family Medicine

## 2023-09-30 ENCOUNTER — Encounter: Payer: Self-pay | Admitting: Family Medicine

## 2023-09-30 VITALS — BP 128/78 | HR 80 | Temp 97.8°F | Ht 63.0 in | Wt 180.2 lb

## 2023-09-30 DIAGNOSIS — E039 Hypothyroidism, unspecified: Secondary | ICD-10-CM | POA: Diagnosis not present

## 2023-09-30 DIAGNOSIS — Z23 Encounter for immunization: Secondary | ICD-10-CM | POA: Diagnosis not present

## 2023-09-30 DIAGNOSIS — Z6831 Body mass index (BMI) 31.0-31.9, adult: Secondary | ICD-10-CM

## 2023-09-30 DIAGNOSIS — Z7689 Persons encountering health services in other specified circumstances: Secondary | ICD-10-CM | POA: Insufficient documentation

## 2023-09-30 DIAGNOSIS — Z130 Encounter for screening for diseases of the blood and blood-forming organs and certain disorders involving the immune mechanism: Secondary | ICD-10-CM

## 2023-09-30 DIAGNOSIS — Z13220 Encounter for screening for lipoid disorders: Secondary | ICD-10-CM

## 2023-09-30 DIAGNOSIS — E66811 Obesity, class 1: Secondary | ICD-10-CM

## 2023-09-30 DIAGNOSIS — Z1211 Encounter for screening for malignant neoplasm of colon: Secondary | ICD-10-CM

## 2023-09-30 MED ORDER — TETANUS-DIPHTH-ACELL PERTUSSIS 5-2.5-18.5 LF-MCG/0.5 IM SUSY: 0.5000 mL | PREFILLED_SYRINGE | Freq: Once | INTRAMUSCULAR | 0 refills | Status: AC

## 2023-09-30 MED ORDER — LEVOTHYROXINE SODIUM 88 MCG PO TABS: 88.0000 ug | ORAL_TABLET | Freq: Every day | ORAL | 3 refills | Status: AC

## 2023-09-30 NOTE — Progress Notes (Signed)
 New Patient Office Visit  Subjective    Patient ID: Kara Roy, female    DOB: February 04, 1965  Age: 58 y.o. MRN: [id number]  CC:  Chief Complaint  Patient presents with   Establish Care    Her previous Dr. Corlis retired she is needing a new physician  Needs medication refill Interested in flu vaccination today      HPI Kara Roy presents to establish care. Oriented to practice routines and expectations. Was seeing PCP regularly. PMH includes hypothyroidism. Concerns include none. Last visit with her PCP was June 2025 and labs were normal at that time with no medication changes or follow up needed. CPE is due in December.  Breast CA screening: Mammogram status: Completed 12/2022. Repeat every year Cervical CA screening: 12/2021 was normal Colon CA screening: colonoscopy 3 years ago with abnormalities. Polyps, overdue for repeat Tobacco: non-smoker Vaccines: flu, tdap, hep B today  Hypothyroidism: stable on Synthroid  88mcg daily    Outpatient Encounter Medications as of 09/30/2023  Medication Sig   Tdap (BOOSTRIX) 5-2.5-18.5 LF-MCG/0.5 injection Inject 0.5 mLs into the muscle once for 1 dose. Administer at pharmacy   levothyroxine  (SYNTHROID ) 88 MCG tablet Take 1 tablet (88 mcg total) by mouth daily.   [DISCONTINUED] levothyroxine  (SYNTHROID ) 88 MCG tablet Take 88 mcg by mouth daily.   No facility-administered encounter medications on file as of 09/30/2023.    Past Medical History:  Diagnosis Date   Hypothyroidism     Past Surgical History:  Procedure Laterality Date   APPENDECTOMY     COLONOSCOPY WITH PROPOFOL  N/A 08/24/2020   Procedure: COLONOSCOPY WITH PROPOFOL ;  Surgeon: Janalyn Keene NOVAK, MD;  Location: [facility] ENDOSCOPY;  Service: Endoscopy;  Laterality: N/A;   FRACTURE SURGERY      Family History  Problem Relation Age of Onset   Multiple myeloma Mother    Atrial fibrillation Father    Peripheral Artery Disease Father    Sleep apnea Father    Sleep  apnea Son     Social History   Socioeconomic History   Marital status: Married    Spouse name: Not on file   Number of children: Not on file   Years of education: Not on file   Highest education level: Associate degree: academic program  Occupational History   Not on file  Tobacco Use   Smoking status: Never    Passive exposure: Never   Smokeless tobacco: Never  Vaping Use   Vaping status: Never Used  Substance and Sexual Activity   Alcohol use: Never   Drug use: Never   Sexual activity: Yes    Birth control/protection: None  Other Topics Concern   Not on file  Social History Narrative   Not on file   Social Drivers of Health   Financial Resource Strain: Not on file  Food Insecurity: Not on file  Transportation Needs: Not on file  Physical Activity: Not on file  Stress: Not on file  Social Connections: Not on file  Intimate Partner Violence: Not on file    Review of Systems  Constitutional:  Negative for malaise/fatigue and weight loss.  Respiratory: Negative.    Cardiovascular: Negative.   Gastrointestinal: Negative.         Objective    BP 128/78   Pulse 80   Temp 97.8 F (36.6 C)   Ht 5' 3 (1.6 m)   Wt 180 lb 3.2 oz (81.7 kg)   SpO2 98%   BMI 31.92 kg/m  Physical Exam Vitals and nursing note reviewed.  Constitutional:      Appearance: Normal appearance. She is obese.  HENT:     Head: Normocephalic and atraumatic.  Cardiovascular:     Rate and Rhythm: Normal rate and regular rhythm.     Pulses: Normal pulses.     Heart sounds: Normal heart sounds.  Pulmonary:     Effort: Pulmonary effort is normal.     Breath sounds: Normal breath sounds.  Skin:    General: Skin is warm and dry.  Neurological:     General: No focal deficit present.     Mental Status: She is alert and oriented to person, place, and time. Mental status is at baseline.  Psychiatric:        Mood and Affect: Mood normal.        Behavior: Behavior normal.        Thought  Content: Thought content normal.        Judgment: Judgment normal.         Assessment & Plan:   Problem List Items Addressed This Visit     Colon cancer screening   Relevant Orders   Ambulatory referral to Gastroenterology   Hypothyroidism   Stable on 88mcg daily Synthroid . Euthyroid on exam. Return for CPE and labs week prior in 3 months.  The correct intake of thyroid hormone (Levothyroxine , Synthroid ), is on empty stomach first thing in the morning, with water, separated by at least 30 minutes from breakfast and other medications,  and separated by more than 4 hours from calcium, iron, multivitamins, acid reflux medications (PPIs).   - This medication is a life-long medication and will be needed to correct thyroid hormone imbalances for the rest of your life.  The dose may change from time to time, based on thyroid blood work.   - It is extremely important to be consistent taking this medication, near the same time each morning.   -AVOID TAKING PRODUCTS CONTAINING BIOTIN (commonly found in Hair, Skin, Nails vitamins) AS IT INTERFERES WITH THE VALIDITY OF THYROID FUNCTION BLOOD TESTS.       Relevant Medications   levothyroxine  (SYNTHROID ) 88 MCG tablet   Other Relevant Orders   TSH   Class 1 obesity without serious comorbidity with body mass index (BMI) of 31.0 to 31.9 in adult   Counseled on importance of weight management for overall health. I recommend low calorie, heart healthy diet and moderate intensity exercise 150 minutes weekly. This is 3-5 times weekly for 30-50 minutes each session. Goal should be pace of 3 miles/hours, or walking 1.5 miles in 30 minutes and include strength training. Keep up the hard work!      Relevant Orders   CBC with Differential/Platelet   Comprehensive metabolic panel with GFR   Lipid panel   Encounter to establish care with new provider - Primary   Today we reviewed your medical history, health maintenance items, and current concerns.  Please schedule colonoscopy ASAP with GI, referral has been placed and they will call you to schedule. CPE in 3 months with labs week prior.      Other Visit Diagnoses       Screening for lipid disorders       Relevant Orders   Lipid panel     Screening for deficiency anemia       Relevant Orders   CBC with Differential/Platelet     Encounter for immunization       Relevant Medications  Tdap (BOOSTRIX) 5-2.5-18.5 LF-MCG/0.5 injection   Other Relevant Orders   Heplisav-B  (HepB-CPG) Vaccine (Completed)   Flu vaccine trivalent PF, 6mos and older(Flulaval,Afluria,Fluarix,Fluzone) (Completed)   Tdap vaccine greater than or equal to 7yo IM (Completed)       Return in about 3 months (around 12/30/2023) for annual physical with labs 1 week prior.   Jeoffrey GORMAN Barrio, FNP

## 2023-09-30 NOTE — Patient Instructions (Signed)
 It was great to meet you today and I'm excited to have you join the [facility] practice. I hope you had a positive experience today! If you feel so inclined, please feel free to recommend our practice to friends and family. Jeoffrey Barrio, FNP-C

## 2023-09-30 NOTE — Assessment & Plan Note (Signed)
 Today we reviewed your medical history, health maintenance items, and current concerns. Please schedule colonoscopy ASAP with GI, referral has been placed and they will call you to schedule. CPE in 3 months with labs week prior.

## 2023-09-30 NOTE — Assessment & Plan Note (Signed)
 Counseled on importance of weight management for overall health. I recommend low calorie, heart healthy diet and moderate intensity exercise 150 minutes weekly. This is 3-5 times weekly for 30-50 minutes each session. Goal should be pace of 3 miles/hours, or walking 1.5 miles in 30 minutes and include strength training. Keep up the hard work!

## 2023-09-30 NOTE — Assessment & Plan Note (Signed)
 Stable on 88mcg daily Synthroid . Euthyroid on exam. Return for CPE and labs week prior in 3 months.  The correct intake of thyroid hormone (Levothyroxine , Synthroid ), is on empty stomach first thing in the morning, with water, separated by at least 30 minutes from breakfast and other medications,  and separated by more than 4 hours from calcium, iron, multivitamins, acid reflux medications (PPIs).   - This medication is a life-long medication and will be needed to correct thyroid hormone imbalances for the rest of your life.  The dose may change from time to time, based on thyroid blood work.   - It is extremely important to be consistent taking this medication, near the same time each morning.   -AVOID TAKING PRODUCTS CONTAINING BIOTIN (commonly found in Hair, Skin, Nails vitamins) AS IT INTERFERES WITH THE VALIDITY OF THYROID FUNCTION BLOOD TESTS.

## 2023-10-06 ENCOUNTER — Other Ambulatory Visit: Payer: Self-pay

## 2023-10-06 ENCOUNTER — Telehealth: Payer: Self-pay

## 2023-10-06 DIAGNOSIS — Z13220 Encounter for screening for lipoid disorders: Secondary | ICD-10-CM

## 2023-10-06 DIAGNOSIS — Z860100 Personal history of colon polyps, unspecified: Secondary | ICD-10-CM

## 2023-10-06 DIAGNOSIS — Z130 Encounter for screening for diseases of the blood and blood-forming organs and certain disorders involving the immune mechanism: Secondary | ICD-10-CM

## 2023-10-06 DIAGNOSIS — Z6831 Body mass index (BMI) 31.0-31.9, adult: Secondary | ICD-10-CM

## 2023-10-06 DIAGNOSIS — E039 Hypothyroidism, unspecified: Secondary | ICD-10-CM

## 2023-10-06 NOTE — Telephone Encounter (Signed)
 Gastroenterology Pre-Procedure Review  Request Date: 01/01/24 Requesting Physician: Dr. Melany  PATIENT REVIEW QUESTIONS: The patient responded to the following health history questions as indicated:    1. Are you having any GI issues? no 2. Do you have a personal history of Polyps? yes (last colonoscopy performed by Dr. Janalyn 08/24/20 recommended repeat in 2 years  with a 2 day prep however she was not able to tolerate the previous prep. Providing a sample of Sutab) 3. Do you have a family history of Colon Cancer or Polyps? no 4. Diabetes Mellitus? no 5. Joint replacements in the past 12 months?no 6. Major health problems in the past 3 months?no 7. Any artificial heart valves, MVP, or defibrillator?no    MEDICATIONS & ALLERGIES:    Patient reports the following regarding taking any anticoagulation/antiplatelet therapy:   Plavix, Coumadin, Eliquis, Xarelto, Lovenox, Pradaxa, Brilinta, or Effient? no Aspirin? no  Patient confirms/reports the following medications:  Current Outpatient Medications  Medication Sig Dispense Refill   levothyroxine  (SYNTHROID ) 88 MCG tablet Take 1 tablet (88 mcg total) by mouth daily. 90 tablet 3   No current facility-administered medications for this visit.    Patient confirms/reports the following allergies:  Allergies  Allergen Reactions   Ciprofloxacin Rash    No orders of the defined types were placed in this encounter.   AUTHORIZATION INFORMATION Primary Insurance: 1D#: Group #:  Secondary Insurance: 1D#: Group #:  SCHEDULE INFORMATION: Date: 01/01/24 Time: Location: [facility]

## 2023-12-08 ENCOUNTER — Other Ambulatory Visit: Payer: Self-pay | Admitting: Family Medicine

## 2023-12-08 DIAGNOSIS — Z1231 Encounter for screening mammogram for malignant neoplasm of breast: Secondary | ICD-10-CM

## 2023-12-14 ENCOUNTER — Inpatient Hospital Stay
Admission: RE | Admit: 2023-12-14 | Discharge: 2023-12-14 | Disposition: A | Payer: Self-pay | Source: Ambulatory Visit | Attending: Family Medicine

## 2023-12-14 ENCOUNTER — Other Ambulatory Visit: Payer: Self-pay | Admitting: *Deleted

## 2023-12-14 DIAGNOSIS — Z1231 Encounter for screening mammogram for malignant neoplasm of breast: Secondary | ICD-10-CM

## 2023-12-17 ENCOUNTER — Telehealth: Payer: Self-pay

## 2023-12-17 NOTE — Telephone Encounter (Signed)
 Copied from CRM [id number]. Topic: Clinical - Lab/Test Results >> Dec 17, 2023  1:17 PM Emylou G wrote: Reason for CRM: Patient called.. said she did labs 12/1 at [facility] at [facility] patient service center.. can we get access?  Patient is asking.. I didn't see anything? - she has a phys 12/10 wants to be able to read them

## 2023-12-23 ENCOUNTER — Encounter: Payer: Self-pay | Admitting: Family Medicine

## 2023-12-23 ENCOUNTER — Ambulatory Visit: Admitting: Family Medicine

## 2023-12-23 VITALS — BP 130/98 | HR 85 | Temp 97.5°F | Ht 63.0 in | Wt 180.2 lb

## 2023-12-23 DIAGNOSIS — S76012A Strain of muscle, fascia and tendon of left hip, initial encounter: Secondary | ICD-10-CM | POA: Insufficient documentation

## 2023-12-23 DIAGNOSIS — R030 Elevated blood-pressure reading, without diagnosis of hypertension: Secondary | ICD-10-CM | POA: Insufficient documentation

## 2023-12-23 DIAGNOSIS — E782 Mixed hyperlipidemia: Secondary | ICD-10-CM | POA: Insufficient documentation

## 2023-12-23 DIAGNOSIS — E66811 Obesity, class 1: Secondary | ICD-10-CM

## 2023-12-23 DIAGNOSIS — Z0000 Encounter for general adult medical examination without abnormal findings: Secondary | ICD-10-CM | POA: Insufficient documentation

## 2023-12-23 DIAGNOSIS — S76012S Strain of muscle, fascia and tendon of left hip, sequela: Secondary | ICD-10-CM

## 2023-12-23 DIAGNOSIS — E039 Hypothyroidism, unspecified: Secondary | ICD-10-CM

## 2023-12-23 NOTE — Progress Notes (Signed)
 Complete physical exam  Patient: Kara Takacs McAdamsFemale    DOB: 1965-12-26 58 y.o.   MRN: [id number]  Chief Complaint  Patient presents with   Annual Exam    Questions regarding heart murmur, taking penicillin to go to dentist. [place] x3 weeks ago, [place].    Subjective:    Kara Roy is a 58 y.o. female who presents today for a complete physical exam. She reports consuming a general diet. Exercise limited by orthopedic injury, has started swim classes She generally feels well. She reports sleeping well. She does not have additional problems to discuss today.   PMH includes hypothyroidism, colon polyps, obesity.   Discussed the use of AI scribe software for clinical note transcription with the patient, who gave verbal consent to proceed.  History of Present Illness Kara Roy is a 58 year old female who presents for CPE.  Approximately three and a half weeks ago, she experienced a fall in [place] , injuring her left hip and left arm. The fall was described as 'nasty', with bruising in multiple areas, including her right knee, after she twisted and fell due to catching her shoestring. She has been undergoing physical therapy and taking ibuprofen and meloxicam for pain management. Her hip has improved, but her left arm continues to bother her.  She is currently engaged in home exercises and physical therapy.  She has a history of elevated cholesterol levels, with a total cholesterol of 202 mg/dL, triglycerides at 821 mg/dL, LDL at 868 mg/dL, and HDL at 39 mg/dL. She is not currently on any medication for cholesterol management. She tries to maintain a healthy diet and exercise routine, including water aerobics twice a week and previously participating in spin classes until her recent fall. ASCVD risk 3.1%  Her blood pressure was noted to be elevated during the visit, with readings of 144/98 mmHg on recheck. She has no history of hypertension and does not monitor her  blood pressure at home. No history of diabetes or smoking.  She has a family history of atrial fibrillation in her father but no known family history of stroke or heart attack. She is postmenopausal and has had normal Pap smears in recent years. She is currently taking Synthroid  and has no issues with medication adherence.  No problems with hearing, vision, cold intolerances, weight fluctuations, tremor, palpitations, nausea, vomiting, diarrhea, constipation, and no recent changes in medication use. Denies chest pain, palpitations, recurrent headaches, vision changes, lightheadedness, dizziness, dyspnea on exertion, or swelling of extremities.  Was previously prescribed antibiotics for dental visits due to what she believes is a history of a murmur. She does not know more details regarding this however would like to discuss potential lack of need for antibiotics. There is no information in her provided medical records of a murmur and she does not recall having had an ECHO.   Most recent fall risk assessment:    12/23/2023    8:27 AM  Fall Risk   Falls in the past year? 1  Number falls in past yr: 1  Injury with Fall? 1  Risk for fall due to : History of fall(s)  Follow up Falls evaluation completed     Most recent depression screenings:    12/23/2023    8:31 AM 12/23/2023    8:27 AM  PHQ 2/9 Scores  PHQ - 2 Score 0 0  PHQ- 9 Score 0     Vision:Within last year and Dental: No current dental  problems and Receives regular dental care Patient Active Problem List   Diagnosis Date Noted   Physical exam, annual 12/23/2023   Moderate mixed hyperlipidemia not requiring statin therapy 12/23/2023   Elevated blood pressure reading in office without diagnosis of hypertension 12/23/2023   Strain of flexor muscle of left hip 12/23/2023   Hypothyroidism 09/30/2023   Class 1 obesity without serious comorbidity with body mass index (BMI) of 31.0 to 31.9 in adult 09/30/2023   Encounter to  establish care with new provider 09/30/2023   Colon cancer screening    Polyp of colon    Past Medical History:  Diagnosis Date   Hypothyroidism    Past Surgical History:  Procedure Laterality Date   APPENDECTOMY     COLONOSCOPY WITH PROPOFOL  N/A 08/24/2020   Procedure: COLONOSCOPY WITH PROPOFOL ;  Surgeon: Janalyn Keene NOVAK, MD;  Location: [facility] ENDOSCOPY;  Service: Endoscopy;  Laterality: N/A;   FRACTURE SURGERY     Social History   Tobacco Use   Smoking status: Never    Passive exposure: Never   Smokeless tobacco: Never  Vaping Use   Vaping status: Never Used  Substance Use Topics   Alcohol use: Never   Drug use: Never   Family History  Problem Relation Age of Onset   Multiple myeloma Mother    Atrial fibrillation Father    Peripheral Artery Disease Father    Sleep apnea Father    Sleep apnea Son    Allergies  Allergen Reactions   Cephalexin    Effexor [Venlafaxine]    Ciprofloxacin Rash      Patient Care Team: Kayla Jeoffrey RAMAN, FNP as PCP - General (Family Medicine)   Review of Systems  Constitutional: Negative.   HENT: Negative.    Eyes: Negative.   Respiratory: Negative.    Cardiovascular: Negative.   Gastrointestinal: Negative.   Genitourinary: Negative.   Musculoskeletal:  Positive for falls and joint pain.  Skin: Negative.   Neurological: Negative.   Endo/Heme/Allergies: Negative.   Psychiatric/Behavioral: Negative.    All other systems reviewed and are negative.     Objective:    BP (!) 130/98   Pulse 85   Temp (!) 97.5 F (36.4 C)   Ht 5' 3 (1.6 m)   Wt 180 lb 3.2 oz (81.7 kg)   SpO2 97%   BMI 31.92 kg/m  BP Readings from Last 3 Encounters:  12/23/23 (!) 130/98  09/30/23 128/78  11/15/22 138/76   Wt Readings from Last 3 Encounters:  12/23/23 180 lb 3.2 oz (81.7 kg)  09/30/23 180 lb 3.2 oz (81.7 kg)  11/15/22 175 lb (79.4 kg)      Physical Exam Vitals and nursing note reviewed.  Constitutional:      Appearance: Normal  appearance. She is obese.  HENT:     Head: Normocephalic and atraumatic.     Right Ear: Tympanic membrane, ear canal and external ear normal.     Left Ear: Tympanic membrane, ear canal and external ear normal.     Nose: Nose normal.     Mouth/Throat:     Mouth: Mucous membranes are moist.     Pharynx: Oropharynx is clear.  Eyes:     Extraocular Movements: Extraocular movements intact.     Conjunctiva/sclera: Conjunctivae normal.     Pupils: Pupils are equal, round, and reactive to light.  Cardiovascular:     Rate and Rhythm: Normal rate and regular rhythm.     Pulses: Normal pulses.  Heart sounds: Normal heart sounds.  Pulmonary:     Effort: Pulmonary effort is normal.     Breath sounds: Normal breath sounds.  Abdominal:     General: Bowel sounds are normal.     Palpations: Abdomen is soft.  Musculoskeletal:        General: Normal range of motion.     Cervical back: Normal range of motion and neck supple.  Skin:    General: Skin is warm and dry.     Capillary Refill: Capillary refill takes less than 2 seconds.  Neurological:     General: No focal deficit present.     Mental Status: She is alert and oriented to person, place, and time. Mental status is at baseline.  Psychiatric:        Mood and Affect: Mood normal.        Behavior: Behavior normal.        Thought Content: Thought content normal.        Judgment: Judgment normal.       No results found for any visits on 12/23/23. Last CBC Lab Results  Component Value Date   WBC 8.5 03/07/2022   HGB 12.6 03/07/2022   HCT 38.3 03/07/2022   MCV 85.7 03/07/2022   MCH 28.2 03/07/2022   RDW 12.9 03/07/2022   PLT 218 03/07/2022   Last metabolic panel Lab Results  Component Value Date   GLUCOSE 78 03/07/2022   NA 140 03/07/2022   K 3.6 03/07/2022   CL 103 03/07/2022   CO2 29 03/07/2022   BUN 12 03/07/2022   CREATININE 0.73 03/07/2022   GFRNONAA >60 03/07/2022   CALCIUM 8.9 03/07/2022   PROT 7.1 03/07/2022    ALBUMIN 3.8 03/07/2022   BILITOT 1.0 03/07/2022   ALKPHOS 65 03/07/2022   AST 28 03/07/2022   ALT 28 03/07/2022   ANIONGAP 8 03/07/2022   Last lipids No results found for: CHOL, HDL, LDLCALC, LDLDIRECT, TRIG, CHOLHDL Last hemoglobin A1c No results found for: HGBA1C Last thyroid functions No results found for: TSH, T3TOTAL, T4TOTAL, FREET4, THYROIDAB Last vitamin D No results found for: 25OHVITD2, 25OHVITD3, VD25OH Last vitamin B12 and Folate No results found for: VITAMINB12, FOLATE       Assessment & Plan:    Routine Health Maintenance and Physical Exam Immunization History  Administered Date(s) Administered   Hepb-cpg 09/30/2023   Influenza, Seasonal, Injecte, Preservative Fre 09/30/2023   Tdap 09/30/2023    Health Maintenance  Topic Date Due   Cervical Cancer Screening (HPV/Pap Cotest)  Never done   Mammogram  Never done   Colonoscopy  08/25/2022   Hepatitis B Vaccines 19-59 Average Risk (2 of 2 - CpG 2-dose series) 10/28/2023   Zoster Vaccines- Shingrix (1 of 2) 12/30/2023 (Originally 12/17/2017)   COVID-19 Vaccine (1 - 2025-26 season) 01/08/2024 (Originally 09/14/2023)   Pneumococcal Vaccine: 50+ Years (1 of 1 - PCV) 09/29/2024 (Originally 12/17/2017)   Hepatitis C Screening  12/22/2024 (Originally 12/17/1985)   HIV Screening  12/22/2024 (Originally 12/18/1982)   DTaP/Tdap/Td (2 - Td or Tdap) 09/29/2033   Influenza Vaccine  Completed   HPV VACCINES  Aged Out   Meningococcal B Vaccine  Aged Out    Discussed health benefits of physical activity, and encouraged her to engage in regular exercise appropriate for her age and condition.  Problem List Items Addressed This Visit     Hypothyroidism   Class 1 obesity without serious comorbidity with body mass index (BMI) of 31.0 to 31.9 in adult  Physical exam, annual - Primary   Moderate mixed hyperlipidemia not requiring statin therapy   Elevated blood pressure reading in office without  diagnosis of hypertension   Strain of flexor muscle of left hip    Assessment and Plan Assessment & Plan Left hip and arm contusions and hip flexor strain Left hip improving, left arm tender. No fractures on imaging. Undergoing physical therapy, taking meloxicam. - Continue physical therapy.  Elevated blood pressure Blood pressure elevated at 148/98 and 130/98. No hypertension history. Low ASCVD risk at 3.1%. - Monitor blood pressure at home for one week. - Schedule nurse visit in one week to recheck blood pressure. - Ensure proper technique when measuring blood pressure at home.  Hyperlipidemia Total cholesterol 202 mg/dL, LDL 868 mg/dL, HDL 39 mg/dL. Low ASCVD risk at 3.1%. - Focus on diet and exercise to manage cholesterol levels. - Avoid processed foods and saturated fats. - Increase intake of fruits and vegetables.  Hypothyroidism Well-managed on levothyroxine . Thyroid level 2.2 on recent labs. - Continue levothyroxine  88 mcg daily. - Schedule follow-up visits at least twice a year to monitor thyroid levels.  Class 1 obesity BMI 31.0-31.9. Engaging in regular physical activity and healthy eating habits. - Continue regular physical activity. - Maintain healthy diet with limited processed foods.  General Health Maintenance Colonoscopy and mammogram scheduled. Hepatitis B vaccine series in progress. No recent hepatitis C or HIV screening. - Administered second dose of hepatitis B vaccine. - Will review records for Pap smear and heart murmur history.    Return in about 4 weeks (around 01/20/2024) for hypertension.    Jeoffrey GORMAN Barrio, FNP [facility]

## 2023-12-23 NOTE — Addendum Note (Signed)
 Addended by: CORINNA BRISKER R on: 12/23/2023 10:03 AM   Modules accepted: Orders

## 2023-12-29 ENCOUNTER — Other Ambulatory Visit: Payer: Self-pay

## 2023-12-29 ENCOUNTER — Encounter: Payer: Self-pay | Admitting: Gastroenterology

## 2023-12-30 NOTE — Anesthesia Preprocedure Evaluation (Addendum)
"                                    Anesthesia Evaluation  Patient identified by MRN, date of birth, ID band Patient awake    Reviewed: Allergy & Precautions, H&P , NPO status , Patient's Chart, lab work & pertinent test results, reviewed documented beta blocker date and time   Airway Mallampati: II  TM Distance: >3 FB Neck ROM: Full    Dental no notable dental hx.    Pulmonary neg pulmonary ROS   Pulmonary exam normal breath sounds clear to auscultation       Cardiovascular Exercise Tolerance: Good negative cardio ROS Normal cardiovascular exam Rhythm:Regular Rate:Normal     Neuro/Psych  Neuromuscular disease negative neurological ROS  negative psych ROS   GI/Hepatic negative GI ROS, Neg liver ROS,,,  Endo/Other  negative endocrine ROSHypothyroidism    Renal/GU negative Renal ROS  negative genitourinary   Musculoskeletal negative musculoskeletal ROS (+)    Abdominal   Peds negative pediatric ROS (+)  Hematology negative hematology ROS (+)   Anesthesia Other Findings hypothyrodism  Reproductive/Obstetrics negative OB ROS                              Anesthesia Physical Anesthesia Plan  ASA: 2  Anesthesia Plan: MAC and General   Post-op Pain Management:    Induction: Intravenous  PONV Risk Score and Plan:   Airway Management Planned: Simple Face Mask  Additional Equipment:   Intra-op Plan:   Post-operative Plan: Extubation in OR  Informed Consent: I have reviewed the patients History and Physical, chart, labs and discussed the procedure including the risks, benefits and alternatives for the proposed anesthesia with the patient or authorized representative who has indicated his/her understanding and acceptance.     Dental advisory given  Plan Discussed with: CRNA  Anesthesia Plan Comments:          Anesthesia Quick Evaluation  "

## 2023-12-31 ENCOUNTER — Other Ambulatory Visit: Payer: Self-pay | Admitting: Family Medicine

## 2023-12-31 ENCOUNTER — Encounter: Payer: Self-pay | Admitting: Family Medicine

## 2023-12-31 ENCOUNTER — Telehealth: Payer: Self-pay

## 2023-12-31 MED ORDER — LISINOPRIL 10 MG PO TABS: 10.0000 mg | ORAL_TABLET | Freq: Every day | ORAL | 3 refills | Status: DC

## 2023-12-31 NOTE — Telephone Encounter (Signed)
 Colonoscopy has been rescheduled from 12/31/24 to 02/19/24 due to Dr. Heriberto is not feeling well.  Colonoscopy has been rescheduled to 02/19/24 with Dr. Melany at [facility] still.  Instructions updated.  Referral updated.  Thanks,  Rosaline CMA

## 2024-01-04 ENCOUNTER — Encounter: Admitting: Family Medicine

## 2024-01-11 ENCOUNTER — Ambulatory Visit
Admission: RE | Admit: 2024-01-11 | Discharge: 2024-01-11 | Disposition: A | Discharge status: Home / Self Care | Source: Ambulatory Visit | Attending: Family Medicine | Admitting: Family Medicine

## 2024-01-11 DIAGNOSIS — Z1231 Encounter for screening mammogram for malignant neoplasm of breast: Secondary | ICD-10-CM | POA: Diagnosis present

## 2024-01-20 ENCOUNTER — Ambulatory Visit: Admitting: Family Medicine

## 2024-01-20 ENCOUNTER — Encounter: Payer: Self-pay | Admitting: Family Medicine

## 2024-01-20 VITALS — BP 136/76 | HR 81 | Temp 98.0°F | Ht 63.0 in | Wt 180.0 lb

## 2024-01-20 DIAGNOSIS — I10 Essential (primary) hypertension: Secondary | ICD-10-CM | POA: Diagnosis not present

## 2024-01-20 MED ORDER — LISINOPRIL 20 MG PO TABS: 20.0000 mg | ORAL_TABLET | Freq: Every day | ORAL | 1 refills | Status: AC

## 2024-01-20 NOTE — Progress Notes (Signed)
 "  Acute Office Visit  Patient ID: Kara Roy, female    DOB: 1965/10/12, 59 y.o.   MRN: [id number]  PCP: Kayla Jeoffrey RAMAN, FNP  Chief Complaint  Patient presents with   Follow-up    Here for 4 month follow up on blood pressure. BP 149/84 in office with pt's cuff.      Subjective:     HPI  [name] is here today for blood pressure follow up. BP was elevated at her CPE on 12/23/2023 at 148/98 and 130/98.  Discussed the use of AI scribe software for clinical note transcription with the patient, who gave verbal consent to proceed.  History of Present Illness Kara Roy is a 59 year old female with hypertension who presents for blood pressure management.  She has been monitoring her blood pressure at home, noting that her home cuff readings are approximately 10 to 12 points higher than those taken in the office. She checks her blood pressure four times a day at various times and activities, with occasional high readings late at night reaching the 160s and 170s. She experiences a sensation of feeling 'off' when her blood pressure is high. She is currently taking lisinopril  10 mg daily, with a 90-day supply.  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 She has not had any injections or physical therapy yet and is scheduled to see a shoulder specialist on Friday. Tylenol is not effective for her pain management. The shoulder issue has been ongoing for about seven weeks.  She works from home, which makes it easy for her to frequently check her blood pressure. No chest pain, headaches, palpitations, or vision changes.   Review of Systems  All other systems reviewed and are negative.   Past Medical History:  Diagnosis Date   Hypothyroidism     Past Surgical History:  Procedure Laterality Date   APPENDECTOMY     COLONOSCOPY WITH PROPOFOL  N/A 08/24/2020   Procedure: COLONOSCOPY WITH PROPOFOL ;  Surgeon:  Janalyn Keene NOVAK, MD;  Location: [facility] ENDOSCOPY;  Service: Endoscopy;  Laterality: N/A;   FRACTURE SURGERY      Outpatient Medications Prior to Visit  Medication Sig Dispense Refill   levothyroxine  (SYNTHROID ) 88 MCG tablet Take 1 tablet (88 mcg total) by mouth daily. 90 tablet 3   lisinopril  (ZESTRIL ) 10 MG tablet Take 1 tablet (10 mg total) by mouth daily. 90 tablet 3   No facility-administered medications prior to visit.    Allergies[1]     Objective:    BP 136/76   Pulse 81   Temp 98 F (36.7 C)   Ht 5' 3 (1.6 m)   Wt 180 lb (81.6 kg)   SpO2 98%   BMI 31.89 kg/m  BP Readings from Last 3 Encounters:  01/20/24 136/76  12/23/23 (!) 130/98  09/30/23 128/78   Wt Readings from Last 3 Encounters:  01/20/24 180 lb (81.6 kg)  12/23/23 180 lb 3.2 oz (81.7 kg)  09/30/23 180 lb 3.2 oz (81.7 kg)      Physical Exam Vitals and nursing note reviewed.  Constitutional:      Appearance: Normal appearance. She is normal weight.  HENT:     Head: Normocephalic and atraumatic.  Cardiovascular:     Rate and Rhythm: Normal rate and regular rhythm.     Pulses: Normal pulses.     Heart sounds: Normal heart sounds.  Pulmonary:     Effort: Pulmonary  effort is normal.     Breath sounds: Normal breath sounds.  Skin:    General: Skin is warm and dry.  Neurological:     General: No focal deficit present.     Mental Status: She is alert and oriented to person, place, and time. Mental status is at baseline.  Psychiatric:        Mood and Affect: Mood normal.        Behavior: Behavior normal.        Thought Content: Thought content normal.        Judgment: Judgment normal.       No results found for any visits on 01/20/24.     Assessment & Plan:   Problem List Items Addressed This Visit   None Visit Diagnoses       Primary hypertension    -  Primary   Relevant Medications   lisinopril  (ZESTRIL ) 20 MG tablet   Other Relevant Orders   Basic Metabolic Panel Without GFR        Assessment and Plan Assessment & Plan Primary hypertension Blood pressure generally within target range with occasional elevations. Home readings higher than office. - Increased lisinopril  to 20 mg daily using two 10 mg tablets until current supply exhausted. - Sent prescription for 20 mg lisinopril  tablets. - Monitor blood pressure at home once or twice daily, especially if unwell. - Report readings to office in 1 month. - CMP today. - Report symptoms such as chest pain, headaches, or vision changes. - Scheduled follow-up in three months or sooner if blood pressure exceeds 140/90 mmHg.    Meds ordered this encounter  Medications   lisinopril  (ZESTRIL ) 20 MG tablet    Sig: Take 1 tablet (20 mg total) by mouth daily.    Dispense:  90 tablet    Refill:  1    Supervising Provider:   DUANNE LOWERS T [[id number]]    Return in about 3 months (around 04/19/2024) for hypertension.  Jeoffrey GORMAN Barrio, FNP [facility]       [1]  Allergies Allergen Reactions   Cephalexin    Effexor [Venlafaxine]    Ciprofloxacin Rash   "

## 2024-01-26 ENCOUNTER — Encounter: Admitting: Family Medicine

## 2024-02-19 ENCOUNTER — Ambulatory Visit: Payer: Self-pay | Admitting: Anesthesiology

## 2024-02-19 ENCOUNTER — Ambulatory Visit
Admission: RE | Admit: 2024-02-19 | Discharge: 2024-02-19 | Disposition: A | Payer: Self-pay | Attending: Gastroenterology | Admitting: Gastroenterology

## 2024-02-19 ENCOUNTER — Encounter: Payer: Self-pay | Admitting: Gastroenterology

## 2024-02-19 ENCOUNTER — Other Ambulatory Visit: Payer: Self-pay

## 2024-02-19 ENCOUNTER — Encounter: Admission: RE | Disposition: A | Payer: Self-pay | Source: Home / Self Care | Attending: Gastroenterology

## 2024-02-19 DIAGNOSIS — Z860100 Personal history of colon polyps, unspecified: Secondary | ICD-10-CM

## 2024-02-19 DIAGNOSIS — Z860101 Personal history of adenomatous and serrated colon polyps: Secondary | ICD-10-CM

## 2024-02-19 DIAGNOSIS — K640 First degree hemorrhoids: Secondary | ICD-10-CM

## 2024-02-19 DIAGNOSIS — Z1211 Encounter for screening for malignant neoplasm of colon: Secondary | ICD-10-CM

## 2024-02-19 MED ORDER — LACTATED RINGERS IV SOLN: INTRAVENOUS | Status: DC

## 2024-02-19 MED ORDER — LIDOCAINE HCL (CARDIAC) PF 100 MG/5ML IV SOSY
PREFILLED_SYRINGE | INTRAVENOUS | Status: DC | PRN
  Administered 2024-02-19: 40 mg via INTRAVENOUS

## 2024-02-19 MED ORDER — PROPOFOL 10 MG/ML IV BOLUS
INTRAVENOUS | Status: DC | PRN
  Administered 2024-02-19: 30 mg via INTRAVENOUS
  Administered 2024-02-19 (×2): 50 mg via INTRAVENOUS
  Administered 2024-02-19 (×2): 30 mg via INTRAVENOUS

## 2024-02-19 MED ORDER — STERILE WATER FOR IRRIGATION IR SOLN
Status: DC | PRN
  Administered 2024-02-19: 1

## 2024-02-19 MED ORDER — SODIUM CHLORIDE 0.9 % IV SOLN: INTRAVENOUS | Status: DC

## 2024-02-19 NOTE — Transfer of Care (Signed)
 Immediate Anesthesia Transfer of Care Note  Patient: Kara Roy  Procedure(s) Performed: COLONOSCOPY  Patient Location: PACU  Anesthesia Type: MAC, General  Level of Consciousness: awake, alert  and patient cooperative  Airway and Oxygen Therapy: Patient Spontanous Breathing and Patient connected to supplemental oxygen  Post-op Assessment: Post-op Vital signs reviewed, Patient's Cardiovascular Status Stable, Respiratory Function Stable, Patent Airway and No signs of Nausea or vomiting  Post-op Vital Signs: Reviewed and stable  Complications: No notable events documented.

## 2024-02-19 NOTE — Anesthesia Postprocedure Evaluation (Signed)
"   Anesthesia Post Note  Patient: Kara Roy  Procedure(s) Performed: COLONOSCOPY  Patient location during evaluation: PACU Anesthesia Type: MAC and General Level of consciousness: awake and alert Pain management: pain level controlled Vital Signs Assessment: post-procedure vital signs reviewed and stable Respiratory status: spontaneous breathing, nonlabored ventilation, respiratory function stable and patient connected to nasal cannula oxygen Cardiovascular status: blood pressure returned to baseline and stable Postop Assessment: no apparent nausea or vomiting Anesthetic complications: no   No notable events documented.   Last Vitals:  Vitals:   02/19/24 0801 02/19/24 0942  BP: (!) 129/56   Pulse: 96 86  Resp: 15 14  Temp: 36.8 C   SpO2: 100% 100%    Last Pain:  Vitals:   02/19/24 0801  TempSrc: Temporal  PainSc: 0-No pain                 Redell MARLA Breaker      "

## 2024-02-19 NOTE — H&P (Signed)
 "  Kara Schaffer, MD [facility] [address] [place], [place] [address] Phone:[phone] Fax : [phone]  Primary Care Physician:  Kayla Jeoffrey RAMAN, FNP Primary Gastroenterologist:  Dr. Schaffer  Pre-Procedure History & Physical: HPI:  Kara Roy is a 59 y.o. female is here for an colonoscopy as surveillance of a 10mm sigmoid AP in 2022.   Fhx CRC? No Blood thinners? No   Past Medical History:  Diagnosis Date   Hypothyroidism     Past Surgical History:  Procedure Laterality Date   APPENDECTOMY     COLONOSCOPY WITH PROPOFOL  N/A 08/24/2020   Procedure: COLONOSCOPY WITH PROPOFOL ;  Surgeon: Janalyn Keene NOVAK, MD;  Location: [facility] ENDOSCOPY;  Service: Endoscopy;  Laterality: N/A;   FRACTURE SURGERY      Prior to Admission medications  Medication Sig Start Date End Date Taking? Authorizing Provider  levothyroxine  (SYNTHROID ) 88 MCG tablet Take 1 tablet (88 mcg total) by mouth daily. 09/30/23  Yes Kayla Jeoffrey S, FNP  lisinopril  (ZESTRIL ) 20 MG tablet Take 1 tablet (20 mg total) by mouth daily. 01/20/24  Yes Kayla Jeoffrey RAMAN, FNP    Allergies as of 10/06/2023 - Review Complete 09/30/2023  Allergen Reaction Noted   Ciprofloxacin Rash 07/19/2020    Family History  Problem Relation Age of Onset   Multiple myeloma Mother    Atrial fibrillation Father    Peripheral Artery Disease Father    Sleep apnea Father    Breast cancer Maternal Grandmother    Breast cancer Cousin    Sleep apnea Son     Social History   Socioeconomic History   Marital status: Married    Spouse name: Not on file   Number of children: Not on file   Years of education: Not on file   Highest education level: Associate degree: academic program  Occupational History   Not on file  Tobacco Use   Smoking status: Never    Passive exposure: Never   Smokeless tobacco: Never  Vaping Use   Vaping status: Never Used  Substance and Sexual Activity   Alcohol use: Never   Drug use: Never    Sexual activity: Yes    Birth control/protection: None  Other Topics Concern   Not on file  Social History Narrative   Not on file   Social Drivers of Health   Tobacco Use: Low Risk (02/19/2024)   Patient History    Smoking Tobacco Use: Never    Smokeless Tobacco Use: Never    Passive Exposure: Never  Financial Resource Strain: Low Risk (01/19/2024)   Overall Financial Resource Strain (CARDIA)    Difficulty of Paying Living Expenses: Not hard at all  Food Insecurity: No Food Insecurity (01/19/2024)   Epic    Worried About Radiation Protection Practitioner of Food in the Last Year: Never true    Ran Out of Food in the Last Year: Never true  Transportation Needs: No Transportation Needs (01/19/2024)   Epic    Lack of Transportation (Medical): No    Lack of Transportation (Non-Medical): No  Physical Activity: Unknown (01/19/2024)   Exercise Vital Sign    Days of Exercise per Week: Patient declined    Minutes of Exercise per Session: Not on file  Stress: No Stress Concern Present (01/19/2024)   [employer] of Occupational Health - Occupational Stress Questionnaire    Feeling of Stress: Not at all  Social Connections: Moderately Isolated (01/19/2024)   Social Connection and Isolation Panel    Frequency of Communication with  Friends and Family: Twice a week    Frequency of Social Gatherings with Friends and Family: Once a week    Attends Religious Services: Never    Database Administrator or Organizations: No    Attends Engineer, Structural: Not on file    Marital Status: Married  Catering Manager Violence: Not on file  Depression (PHQ2-9): Low Risk (01/20/2024)   Depression (PHQ2-9)    PHQ-2 Score: 0  Alcohol Screen: Not on file  Housing: Low Risk (01/19/2024)   Epic    Unable to Pay for Housing in the Last Year: No    Number of Times Moved in the Last Year: 0    Homeless in the Last Year: No  Utilities: Not on file  Health Literacy: Not on file    Review of Systems: See HPI, otherwise  negative ROS  Physical Exam: BP (!) 129/56   Pulse 96   Temp 98.3 F (36.8 C) (Temporal)   Resp 15   Ht 5' 3 (1.6 m)   Wt 79.4 kg   SpO2 100%   BMI 31.00 kg/m  CONSTITUTIONAL: Well-appearing in no acute distress.  HEENT: Pupils equal, round, Extraocular movements intact. Conjunctivae clear NECK: Neck supple CARDIOVASCULAR: Regular rate, no LE edema  RESPIRATORY: No labored breathing  ABDOMEN: Abdomen soft, nontender, not distended, no guarding, no rigidity SKIN: No apparent skin rashes or lesions. NEUROLOGIC: Normal speech, no focal findings. Mental status alert and oriented x4. PSYCHIATRIC: Mood and affect normal.   Impression/Plan: Kara Roy is here for an colonoscopy to be performed for as surveillance of a 10mm sigmoid AP in 2022.  Risks, benefits, limitations, and alternatives regarding  colonoscopy have been reviewed with the patient.  Questions have been answered.  All parties agreeable.   MELANY Kara HERO, MD  02/19/2024, 8:05 AM  "

## 2024-02-19 NOTE — Op Note (Signed)
 [facility] Patient Name: Kara Roy Procedure Date: 02/19/2024 9:09 AM MRN: [id number] Account #: [id number] Date of Birth: 10/01/1965 Admit Type: Outpatient Age: 59 Room: [facility] OR ROOM 01 Gender: Female Note Status: Finalized Instrument Name: Peds Colonoscope [id number] Procedure:             Colonoscopy Indications:           Screening for colorectal malignant neoplasm Providers:             [name], MD Referring MD:          Jeoffrey RAMAN. Kayla (Referring MD) Medicines:             Propofol  per Anesthesia Complications:         No immediate complications. Procedure:             Pre-Anesthesia Assessment:                        - Prior to the procedure, a History and Physical was                         performed, and patient medications and allergies were                         reviewed. The patient's tolerance of previous                         anesthesia was also reviewed. The risks and benefits                         of the procedure and the sedation options and risks                         were discussed with the patient. All questions were                         answered, and informed consent was obtained. Prior                         Anticoagulants: The patient has taken no anticoagulant                         or antiplatelet agents. ASA Grade Assessment: II - A                         patient with mild systemic disease. After reviewing                         the risks and benefits, the patient was deemed in                         satisfactory condition to undergo the procedure.                        After obtaining informed consent, the colonoscope was                         passed under direct vision. Throughout the procedure,  the patient's blood pressure, pulse, and oxygen                         saturations were monitored continuously. The                         Colonoscope was introduced through  the anus and                         advanced to the the cecum, identified by appendiceal                         orifice and ileocecal valve. The ileocecal valve,                         appendiceal orifice, and rectum were photographed. Findings:      Internal hemorrhoids were found. The hemorrhoids were Grade I (internal       hemorrhoids that do not prolapse). Impression:            - Internal hemorrhoids.                        - No specimens collected. Recommendation:        - Patient has a contact number available for                         emergencies. The signs and symptoms of potential                         delayed complications were discussed with the patient.                         Return to normal activities tomorrow. Written                         discharge instructions were provided to the patient.                        - High fiber diet.                        - Patient has a contact number available for                         emergencies. The signs and symptoms of potential                         delayed complications were discussed with the patient.                         Return to normal activities tomorrow. Written                         discharge instructions were provided to the patient.                        - Continue present medications.                        -  Repeat colonoscopy in 10 years.                        - The findings and recommendations were discussed with                         the designated responsible adult. Procedure Code(s):     --- Professional ---                        H9878, Colorectal cancer screening; colonoscopy on                         individual not meeting criteria for high risk Diagnosis Code(s):     --- Professional ---                        Z12.11, Encounter for screening for malignant neoplasm                         of colon CPT copyright 2022 American Medical Association. All rights reserved. The codes  documented in this report are preliminary and upon coder review may  be revised to meet current compliance requirements. [name], MD 02/19/2024 9:39:16 AM Number of Addenda: 0 Note Initiated On: 02/19/2024 9:09 AM Scope Withdrawal Time: 0 hours 7 minutes 51 seconds  Total Procedure Duration: 0 hours 12 minutes 6 seconds  Estimated Blood Loss:  Estimated blood loss: none.      [facility]

## 2024-04-19 ENCOUNTER — Ambulatory Visit: Admitting: Family Medicine
